# Patient Record
Sex: Female | Born: 1980 | Marital: Married | Smoking: Never smoker
Health system: Northeastern US, Community
[De-identification: ages and names within clinical notes are randomized; demographics above are authoritative.]

## PROBLEM LIST (undated history)

## (undated) DIAGNOSIS — E119 Type 2 diabetes mellitus without complications: Secondary | ICD-10-CM

---
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
## 2002-11-05 DIAGNOSIS — N946 Dysmenorrhea, unspecified: Secondary | ICD-10-CM | POA: Insufficient documentation

## 2004-04-20 ENCOUNTER — Emergency Department (HOSPITAL_BASED_OUTPATIENT_CLINIC_OR_DEPARTMENT_OTHER): Payer: Self-pay | Admitting: Emergency Medicine

## 2004-04-20 LAB — CT CORONAL VIEW-ADDITIONAL VIE

## 2004-04-20 LAB — XR ANKLE LEFT MINIMUM 3 VIEWS

## 2004-04-20 LAB — LUMBAR SPINE, AP, LAT & SPOT

## 2004-04-20 LAB — KNEE, LEFT 4 VIEWS

## 2004-04-20 LAB — XR TIBIA FIBULA LEFT 2 VIEWS

## 2004-04-20 LAB — XR CERVICAL SPINE WITH OBLIQUES 5 VIEWS

## 2004-04-20 LAB — CT CERVICAL SPINE WO CONTRAST

## 2004-04-20 LAB — XR FOOT LEFT MINIMUM 3 VIEWS

## 2004-04-20 LAB — HCG QUALITATIVE SERUM: HCG QUALITATIVE SERUM: NEGATIVE

## 2004-04-20 LAB — XR THORACIC SPINE 2 VIEWS

## 2005-07-17 DIAGNOSIS — Z8742 Personal history of other diseases of the female genital tract: Secondary | ICD-10-CM | POA: Insufficient documentation

## 2005-11-28 DIAGNOSIS — M545 Low back pain, unspecified: Secondary | ICD-10-CM | POA: Insufficient documentation

## 2006-04-03 DIAGNOSIS — R7611 Nonspecific reaction to tuberculin skin test without active tuberculosis: Secondary | ICD-10-CM | POA: Insufficient documentation

## 2006-08-29 ENCOUNTER — Emergency Department: Payer: Self-pay | Admitting: Physician Assistant

## 2006-08-30 LAB — XR LUMBAR SPINE 2 OR 3 VIEWS

## 2006-08-30 LAB — XR FOREARM LEFT 2 VIEWS

## 2006-08-30 LAB — XR RIBS LEFT WITH PA CHEST

## 2006-09-02 LAB — EMERGENCY ROOM NOTE

## 2007-06-30 ENCOUNTER — Emergency Department (HOSPITAL_BASED_OUTPATIENT_CLINIC_OR_DEPARTMENT_OTHER)
Admission: RE | Admit: 2007-06-30 | Disposition: A | Discharge status: Home / Self Care | Payer: Self-pay | Source: Emergency Department | Attending: Physician Assistant | Admitting: Physician Assistant

## 2007-06-30 ENCOUNTER — Encounter (HOSPITAL_BASED_OUTPATIENT_CLINIC_OR_DEPARTMENT_OTHER): Payer: Self-pay

## 2007-06-30 LAB — EMERGENCY ROOM NOTE

## 2007-06-30 MED ORDER — VICODIN 5-500 MG PO TABS
ORAL_TABLET | ORAL | Status: AC
Start: 2007-06-30 — End: 2007-07-30

## 2007-06-30 NOTE — ED Notes (Signed)
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
## 2007-07-01 ENCOUNTER — Ambulatory Visit (HOSPITAL_BASED_OUTPATIENT_CLINIC_OR_DEPARTMENT_OTHER): Payer: Self-pay | Admitting: Occupational Medicine

## 2014-01-21 ENCOUNTER — Ambulatory Visit (HOSPITAL_BASED_OUTPATIENT_CLINIC_OR_DEPARTMENT_OTHER): Payer: Self-pay | Admitting: Internal Medicine

## 2014-01-21 LAB — XR WRIST RIGHT MINIMUM 3 VIEWS

## 2014-01-21 LAB — XR ANKLE LEFT MINIMUM 3 VIEWS

## 2014-01-26 ENCOUNTER — Ambulatory Visit (HOSPITAL_BASED_OUTPATIENT_CLINIC_OR_DEPARTMENT_OTHER): Payer: Self-pay | Admitting: Internal Medicine

## 2014-01-28 ENCOUNTER — Ambulatory Visit (HOSPITAL_BASED_OUTPATIENT_CLINIC_OR_DEPARTMENT_OTHER): Payer: Self-pay | Admitting: Internal Medicine

## 2014-02-04 ENCOUNTER — Ambulatory Visit (HOSPITAL_BASED_OUTPATIENT_CLINIC_OR_DEPARTMENT_OTHER): Payer: Self-pay | Admitting: Internal Medicine

## 2014-09-02 DIAGNOSIS — M79672 Pain in left foot: Secondary | ICD-10-CM | POA: Insufficient documentation

## 2014-09-02 DIAGNOSIS — N644 Mastodynia: Secondary | ICD-10-CM | POA: Insufficient documentation

## 2014-09-02 DIAGNOSIS — M79641 Pain in right hand: Secondary | ICD-10-CM | POA: Insufficient documentation

## 2014-11-26 DIAGNOSIS — G43009 Migraine without aura, not intractable, without status migrainosus: Secondary | ICD-10-CM | POA: Insufficient documentation

## 2015-07-07 DIAGNOSIS — E6609 Other obesity due to excess calories: Secondary | ICD-10-CM | POA: Insufficient documentation

## 2015-07-07 DIAGNOSIS — E559 Vitamin D deficiency, unspecified: Secondary | ICD-10-CM | POA: Insufficient documentation

## 2015-12-22 DIAGNOSIS — R29818 Other symptoms and signs involving the nervous system: Secondary | ICD-10-CM | POA: Insufficient documentation

## 2016-03-23 DIAGNOSIS — B009 Herpesviral infection, unspecified: Secondary | ICD-10-CM | POA: Insufficient documentation

## 2016-06-02 ENCOUNTER — Emergency Department (HOSPITAL_BASED_OUTPATIENT_CLINIC_OR_DEPARTMENT_OTHER)
Admission: RE | Admit: 2016-06-02 | Disposition: A | Discharge status: Home / Self Care | Payer: Self-pay | Source: Emergency Department | Attending: Emergency Medicine | Admitting: Emergency Medicine

## 2016-06-02 ENCOUNTER — Encounter (HOSPITAL_BASED_OUTPATIENT_CLINIC_OR_DEPARTMENT_OTHER): Payer: Self-pay

## 2016-06-02 MED ORDER — IBUPROFEN 800 MG PO TABS: 800 mg | tablet | Freq: Three times a day (TID) | ORAL | 0 refills | 0 days | Status: AC | PRN

## 2016-06-02 MED ORDER — IBUPROFEN 800 MG PO TABS
800.0000 mg | ORAL_TABLET | Freq: Once | ORAL | Status: DC
Start: 2016-06-02 — End: 2016-06-02
  Filled 2016-06-02: qty 1

## 2016-06-02 MED ORDER — IBUPROFEN 800 MG PO TABS
800.0000 mg | ORAL_TABLET | Freq: Three times a day (TID) | ORAL | 0 refills | Status: AC | PRN
Start: 2016-06-02 — End: 2016-06-07
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
## 2016-06-02 NOTE — Narrator Note (Signed)
Patient Disposition    Patient education for diagnosis, medications, activity, diet and follow-up.  Patient left ED 9:54 AM.  Patient rep left before she received written instructions.  Interpreter to provide instructions: No    Patient belongings with patient: YES    Have all existing LDAs been addressed? N/A    Have all IV infusions been stopped? N/A    Discharged to: Discharged to home
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
## 2016-06-02 NOTE — Narrator Note (Signed)
Pt left prior to review of d/c paperwork and medication for pain. Able to ambulate steadily out of ED.

## 2016-06-02 NOTE — Discharge Instructions (Signed)
Contusin  (Contusion)  Una contusin es un hematoma profundo. Las contusiones son el resultado de una lesin que causa sangrado debajo de la piel. La zona de la contusin puede ponerse [place], [place] o [place]. Las lesiones menores causarn contusiones sin Social research officer, government, Armed forces training and education officer las ms graves pueden presentar dolor e inflamacin durante un par de semanas.   CAUSAS   Generalmente, una contusin se debe a un golpe, un traumatismo o una fuerza directa en una zona del cuerpo.  SNTOMAS    Hinchazn y enrojecimiento en la zona de la lesin.   Hematomas en la zona de la lesin.   Dolor con la palpacin y sensibilidad en la zona de la lesin.   Dolor.  DIAGNSTICO   Se puede establecer el diagnstico al hacer una historia clnica y un examen fsico. Letitia Caul vez sea necesario hacer una radiografa, una tomografa computarizada o una resonancia magntica para determinar si hay lesiones asociadas, como fracturas.  TRATAMIENTO   El tratamiento especfico depender de la zona del cuerpo donde se produjo la lesin. En general, el mejor tratamiento para una contusin es el reposo, la aplicacin de hielo, la elevacin de la zona y la aplicacin de compresas fras en la zona de la lesin. Para calmar el dolor tambin podrn recomendarle medicamentos de venta libre. Pregntele al mdico cul es el mejor tratamiento para su contusin.  INSTRUCCIONES PARA EL CUIDADO EN EL HOGAR    Aplique hielo sobre la zona lesionada.    Ponga el hielo en una bolsa plstica.    Colquese una toalla entre la piel y la bolsa de hielo.    Deje el hielo durante 15 a 81mnutos, 3 a 4veces por da, o sGibsonlos medicamentos de venta libre o recetados para cGlass blower/designer el malestar o la fLos Barreras segn se lo indique el mdico. El mdico podr indicarle que evite tomar antiinflamatorios (aspirina, ibuprofeno y naproxeno) durante 47horas ya que estos medicamentos pueden aumentar los hematomas.   Mantenga la zona de la lesin  en reposo.   Si es posible, eleve la zona de la lesin para reducir la hinchazn.  SOLICITE ATENCIN MDICA DE INMEDIATO SI:    El hematoma o la hinchazn aumentan.   Siente dolor que [place]   La hinchazn o el dolor no se [employer]  ASEGRESE DE QUE:    Comprende estas instrucciones.   Controlar su afeccin.   Recibir ayuda de inmediato si no mejora o si empeora.     Esta informacin no tiene cMarine scientistel consejo del mdico. Asegrese de hacerle al mdico cualquier pregunta que tenga.     Document Released: 04/11/2005 Document Revised: 07/07/2013  Elsevier Interactive Patient Education [employer]

## 2016-08-07 DIAGNOSIS — R002 Palpitations: Secondary | ICD-10-CM | POA: Insufficient documentation

## 2016-08-08 DIAGNOSIS — R0789 Other chest pain: Secondary | ICD-10-CM | POA: Insufficient documentation

## 2016-08-29 DIAGNOSIS — D0502 Lobular carcinoma in situ of left breast: Secondary | ICD-10-CM | POA: Insufficient documentation

## 2016-10-17 DIAGNOSIS — M722 Plantar fascial fibromatosis: Secondary | ICD-10-CM | POA: Insufficient documentation

## 2016-11-24 DIAGNOSIS — R1013 Epigastric pain: Secondary | ICD-10-CM | POA: Insufficient documentation

## 2016-12-25 DIAGNOSIS — R300 Dysuria: Secondary | ICD-10-CM | POA: Insufficient documentation

## 2017-04-08 DIAGNOSIS — R102 Pelvic and perineal pain unspecified side: Secondary | ICD-10-CM | POA: Insufficient documentation

## 2017-04-08 DIAGNOSIS — M542 Cervicalgia: Secondary | ICD-10-CM | POA: Insufficient documentation

## 2017-04-08 DIAGNOSIS — R1319 Other dysphagia: Secondary | ICD-10-CM | POA: Insufficient documentation

## 2017-04-09 DIAGNOSIS — Z30431 Encounter for routine checking of intrauterine contraceptive device: Secondary | ICD-10-CM | POA: Insufficient documentation

## 2017-04-09 DIAGNOSIS — R399 Unspecified symptoms and signs involving the genitourinary system: Secondary | ICD-10-CM | POA: Insufficient documentation

## 2017-04-17 DIAGNOSIS — R7989 Other specified abnormal findings of blood chemistry: Secondary | ICD-10-CM | POA: Insufficient documentation

## 2017-05-13 DIAGNOSIS — R0781 Pleurodynia: Secondary | ICD-10-CM | POA: Insufficient documentation

## 2017-05-13 DIAGNOSIS — R0789 Other chest pain: Secondary | ICD-10-CM | POA: Insufficient documentation

## 2017-09-16 DIAGNOSIS — K7581 Nonalcoholic steatohepatitis (NASH): Secondary | ICD-10-CM | POA: Insufficient documentation

## 2017-09-16 LAB — HEMOGLOBIN A1C CARE EVERYWHERE
HEMOGLOBIN A1C CARE EVERYWHERE: 5.7 % — NL (ref 4.3–6.4)
MEAN BLOOD GLUCOSE CARE EVERYWHERE: 117 mg/dL — NL

## 2017-10-15 DIAGNOSIS — R1030 Lower abdominal pain, unspecified: Secondary | ICD-10-CM | POA: Insufficient documentation

## 2017-10-15 DIAGNOSIS — K648 Other hemorrhoids: Secondary | ICD-10-CM | POA: Insufficient documentation

## 2017-10-15 DIAGNOSIS — I8390 Asymptomatic varicose veins of unspecified lower extremity: Secondary | ICD-10-CM | POA: Insufficient documentation

## 2017-10-15 DIAGNOSIS — B349 Viral infection, unspecified: Secondary | ICD-10-CM | POA: Insufficient documentation

## 2017-10-15 DIAGNOSIS — K5900 Constipation, unspecified: Secondary | ICD-10-CM | POA: Insufficient documentation

## 2018-08-25 ENCOUNTER — Emergency Department
Admission: EM | Admit: 2018-08-25 | Discharge: 2018-08-25 | Disposition: A | Discharge status: Home / Self Care | Payer: No Typology Code available for payment source | Source: Intra-hospital | Attending: Emergency Medicine | Admitting: Emergency Medicine

## 2018-08-25 ENCOUNTER — Encounter (HOSPITAL_BASED_OUTPATIENT_CLINIC_OR_DEPARTMENT_OTHER): Payer: Self-pay

## 2018-08-25 ENCOUNTER — Emergency Department (HOSPITAL_BASED_OUTPATIENT_CLINIC_OR_DEPARTMENT_OTHER): Payer: No Typology Code available for payment source

## 2018-08-25 DIAGNOSIS — R0789 Other chest pain: Secondary | ICD-10-CM | POA: Diagnosis present

## 2018-08-25 DIAGNOSIS — R918 Other nonspecific abnormal finding of lung field: Secondary | ICD-10-CM | POA: Diagnosis not present

## 2018-08-25 DIAGNOSIS — R079 Chest pain, unspecified: Secondary | ICD-10-CM

## 2018-08-25 DIAGNOSIS — R1012 Left upper quadrant pain: Secondary | ICD-10-CM | POA: Diagnosis not present

## 2018-08-25 HISTORY — DX: Type 2 diabetes mellitus without complications: E11.9

## 2018-08-25 LAB — CBC, PLATELET & DIFFERENTIAL
ABSOLUTE BASO COUNT: 0.1 10*3/uL (ref 0.0–0.1)
ABSOLUTE EOSINOPHIL COUNT: 0.2 10*3/uL (ref 0.0–0.8)
ABSOLUTE IMM GRAN COUNT: 0.02 10*3/uL (ref 0.00–0.03)
ABSOLUTE LYMPH COUNT: 3.9 10*3/uL (ref 0.6–5.9)
ABSOLUTE MONO COUNT: 0.5 10*3/uL (ref 0.2–1.4)
ABSOLUTE NEUTROPHIL COUNT: 3.2 10*3/uL (ref 1.6–8.3)
ABSOLUTE NRBC COUNT: 0 10*3/uL (ref 0.0–0.0)
BASOPHIL %: 0.6 % (ref 0.0–1.2)
EOSINOPHIL %: 2.6 % (ref 0.0–7.0)
HEMATOCRIT: 37.5 % (ref 34.1–44.9)
HEMOGLOBIN: 12.2 g/dL (ref 11.2–15.7)
IMMATURE GRANULOCYTE %: 0.3 % (ref 0.0–0.4)
LYMPHOCYTE %: 49.4 % (ref 15.0–54.0)
MEAN CORP HGB CONC: 32.5 g/dL (ref 31.0–37.0)
MEAN CORPUSCULAR HGB: 30.7 pg (ref 26.0–34.0)
MEAN CORPUSCULAR VOL: 94.5 fl (ref 80.0–100.0)
MEAN PLATELET VOLUME: 11.5 fL (ref 8.7–12.5)
MONOCYTE %: 6.5 % (ref 4.0–13.0)
NEUTROPHIL %: 40.6 % (ref 40.0–75.0)
NRBC %: 0 % (ref 0.0–0.0)
PLATELET COUNT: 264 10*3/uL (ref 150–400)
RBC DISTRIBUTION WIDTH STD DEV: 43 fL (ref 35.1–46.3)
RED BLOOD CELL COUNT: 3.97 M/uL (ref 3.90–5.20)
WHITE BLOOD CELL COUNT: 7.8 10*3/uL (ref 4.0–11.0)

## 2018-08-25 LAB — HEPATIC FUNCTION PANEL
ALANINE AMINOTRANSFERASE: 114 U/L — ABNORMAL HIGH (ref 12–45)
ALBUMIN: 3.4 g/dL (ref 3.4–5.0)
ALKALINE PHOSPHATASE: 118 U/L — ABNORMAL HIGH (ref 45–117)
ASPARTATE AMINOTRANSFERASE: 77 U/L — ABNORMAL HIGH (ref 8–34)
BILIRUBIN DIRECT: 0.1 mg/dl (ref 0.0–0.2)
BILIRUBIN TOTAL: 0.3 mg/dL (ref 0.2–1.0)
INDIRECT BILIRUBIN: 0.2 mg/dL (ref 0.2–0.9)
TOTAL PROTEIN: 6.8 g/dL (ref 6.4–8.2)

## 2018-08-25 LAB — BASIC METABOLIC PANEL
ANION GAP: 12 mmol/L (ref 5–15)
BUN (UREA NITROGEN): 9 mg/dL (ref 7–18)
CALCIUM: 7.7 mg/dL — ABNORMAL LOW (ref 8.5–10.1)
CARBON DIOXIDE: 24 mmol/L (ref 21–32)
CHLORIDE: 106 mmol/L (ref 98–107)
CREATININE: 0.9 mg/dL (ref 0.4–1.2)
ESTIMATED GLOMERULAR FILT RATE: >60 mL/min (ref >60)
Glucose Random: 249 mg/dL — ABNORMAL HIGH (ref 74–160)
POTASSIUM: 3.7 mmol/L (ref 3.5–5.1)
SODIUM: 142 mmol/L (ref 136–145)

## 2018-08-25 LAB — LIPASE: LIPASE: 208 U/L (ref 73–393)

## 2018-08-25 LAB — ETHANOL: ETHANOL: 158 mg/dL — ABNORMAL HIGH (ref 0–3)

## 2018-08-25 LAB — TROPONIN I
TROPONIN I: <0.02 ng/mL (ref 0.00–0.04)
TROPONIN I: <0.02 ng/mL (ref 0.00–0.04)

## 2018-08-25 MED ORDER — ALUMINUM & MAGNESIUM HYDROXIDE 200-200 MG/5ML PO SUSP
30.0000 mL | Freq: Once | ORAL | Status: AC
Start: 2018-08-25 — End: 2018-08-25
  Administered 2018-08-25: 30 mL via ORAL
  Filled 2018-08-25: qty 30

## 2018-08-25 MED ORDER — FAMOTIDINE 20 MG PO TABS
20.00 mg | ORAL_TABLET | Freq: Two times a day (BID) | ORAL | 0 refills | Status: AC
Start: 2018-08-25 — End: 2018-09-01

## 2018-08-25 MED ORDER — LIDOCAINE VISCOUS HCL 2 % MT SOLN
10.0000 mL | Freq: Once | OROMUCOSAL | Status: AC
Start: 2018-08-25 — End: 2018-08-25
  Administered 2018-08-25: 10 mL via OROMUCOSAL
  Filled 2018-08-25: qty 15

## 2018-08-25 MED ORDER — FAMOTIDINE 20 MG PO TABS: 20 mg | tablet | Freq: Two times a day (BID) | ORAL | 0 refills | 0 days | Status: AC

## 2018-08-25 MED ORDER — ONDANSETRON HCL 4 MG/2ML IJ SOLN
INTRAMUSCULAR | Status: DC
Start: 2018-08-25 — End: 2018-08-25
  Filled 2018-08-25: qty 2

## 2018-08-25 MED ORDER — FAMOTIDINE 20 MG PO TABS
20.0000 mg | ORAL_TABLET | Freq: Once | ORAL | Status: AC
Start: 2018-08-25 — End: 2018-08-25
  Administered 2018-08-25: 20 mg via ORAL
  Filled 2018-08-25: qty 1

## 2018-08-25 NOTE — Narrator Note (Signed)
Pt resting in bed comfortably. Will continue to monitor
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
## 2018-08-25 NOTE — Narrator Note (Signed)
Pt stated she feels better but it still a little nauseated.  Pt asking for something to drink.  Provider aware and stated ok for pt to drink.
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
## 2018-08-25 NOTE — ED Notes (Signed)
Bed: 14-A  Expected date:   Expected time:   Means of arrival:   Comments:  CP

## 2018-08-25 NOTE — Narrator Note (Signed)
Patient Disposition    Patient education for diagnosis, medications, activity, diet and follow-up.  Patient left ED 7:14 AM.  Patient rep received written instructions.  Interpreter to provide instructions: No    Patient belongings with patient: YES    Have all existing LDAs been addressed? N/A    Have all IV infusions been stopped? Yes    Discharged to: Discharged to home

## 2018-08-25 NOTE — Narrator Note (Signed)
Pt states ready to go home. Pt discharge home. Pt denied having any question on discharge instructions.

## 2018-08-25 NOTE — Narrator Note (Signed)
Pt to Xray at this time

## 2018-08-28 LAB — EKG

## 2018-09-22 DIAGNOSIS — R109 Unspecified abdominal pain: Secondary | ICD-10-CM | POA: Insufficient documentation

## 2018-10-10 ENCOUNTER — Encounter (HOSPITAL_BASED_OUTPATIENT_CLINIC_OR_DEPARTMENT_OTHER): Payer: Self-pay | Admitting: Emergency Medicine

## 2018-10-10 ENCOUNTER — Emergency Department
Admission: EM | Admit: 2018-10-10 | Discharge: 2018-10-10 | Disposition: A | Discharge status: Home / Self Care | Payer: No Typology Code available for payment source | Source: Intra-hospital | Attending: Emergency Medicine | Admitting: Emergency Medicine

## 2018-10-10 ENCOUNTER — Emergency Department (HOSPITAL_BASED_OUTPATIENT_CLINIC_OR_DEPARTMENT_OTHER): Payer: No Typology Code available for payment source

## 2018-10-10 ENCOUNTER — Other Ambulatory Visit: Payer: Self-pay

## 2018-10-10 DIAGNOSIS — R079 Chest pain, unspecified: Secondary | ICD-10-CM | POA: Diagnosis not present

## 2018-10-10 DIAGNOSIS — R05 Cough: Secondary | ICD-10-CM

## 2018-10-10 DIAGNOSIS — J069 Acute upper respiratory infection, unspecified: Secondary | ICD-10-CM | POA: Diagnosis not present

## 2018-10-10 DIAGNOSIS — R509 Fever, unspecified: Secondary | ICD-10-CM | POA: Diagnosis present

## 2018-10-10 DIAGNOSIS — M791 Myalgia, unspecified site: Secondary | ICD-10-CM | POA: Diagnosis not present

## 2018-10-10 LAB — COMPREHENSIVE METABOLIC PANEL
ALANINE AMINOTRANSFERASE: 213 U/L — ABNORMAL HIGH (ref 12–45)
ALBUMIN: 3.4 g/dL (ref 3.4–5.0)
ALKALINE PHOSPHATASE: 99 U/L (ref 45–117)
ANION GAP: 9 mmol/L (ref 5–15)
ASPARTATE AMINOTRANSFERASE: 234 U/L — ABNORMAL HIGH (ref 8–34)
BILIRUBIN TOTAL: 0.3 mg/dL (ref 0.2–1.0)
BUN (UREA NITROGEN): 6 mg/dL — ABNORMAL LOW (ref 7–18)
CALCIUM: 8.4 mg/dL — ABNORMAL LOW (ref 8.5–10.1)
CARBON DIOXIDE: 26 mmol/L (ref 21–32)
CHLORIDE: 105 mmol/L (ref 98–107)
CREATININE: 0.8 mg/dL (ref 0.4–1.2)
ESTIMATED GLOMERULAR FILT RATE: >60 mL/min (ref >60)
Glucose Random: 204 mg/dL — ABNORMAL HIGH (ref 74–160)
POTASSIUM: 3.6 mmol/L (ref 3.5–5.1)
SODIUM: 140 mmol/L (ref 136–145)
TOTAL PROTEIN: 7.2 g/dL (ref 6.4–8.2)

## 2018-10-10 LAB — CBC, PLATELET & DIFFERENTIAL
ABSOLUTE BASO COUNT: 0 10*3/uL (ref 0.0–0.1)
ABSOLUTE EOSINOPHIL COUNT: 0 10*3/uL (ref 0.0–0.8)
ABSOLUTE IMM GRAN COUNT: 0.01 10*3/uL (ref 0.00–0.03)
ABSOLUTE LYMPH COUNT: 2 10*3/uL (ref 0.6–5.9)
ABSOLUTE MONO COUNT: 0.4 10*3/uL (ref 0.2–1.4)
ABSOLUTE NEUTROPHIL COUNT: 1.7 10*3/uL (ref 1.6–8.3)
ABSOLUTE NRBC COUNT: 0 10*3/uL (ref 0.0–0.0)
BASOPHIL %: 0.5 % (ref 0.0–1.2)
EOSINOPHIL %: 0.2 % (ref 0.0–7.0)
HEMATOCRIT: 40.2 % (ref 34.1–44.9)
HEMOGLOBIN: 13.3 g/dL (ref 11.2–15.7)
IMMATURE GRANULOCYTE %: 0.2 % (ref 0.0–0.4)
LYMPHOCYTE %: 48.8 % (ref 15.0–54.0)
MEAN CORP HGB CONC: 33.1 g/dL (ref 31.0–37.0)
MEAN CORPUSCULAR HGB: 31.1 pg (ref 26.0–34.0)
MEAN CORPUSCULAR VOL: 94.1 fl (ref 80.0–100.0)
MEAN PLATELET VOLUME: 11.8 fL (ref 8.7–12.5)
MONOCYTE %: 10 % (ref 4.0–13.0)
NEUTROPHIL %: 40.3 % (ref 40.0–75.0)
NRBC %: 0 % (ref 0.0–0.0)
PLATELET COUNT: 146 10*3/uL — ABNORMAL LOW (ref 150–400)
RBC DISTRIBUTION WIDTH STD DEV: 44.1 fL (ref 35.1–46.3)
RED BLOOD CELL COUNT: 4.27 M/uL (ref 3.90–5.20)
WHITE BLOOD CELL COUNT: 4.2 10*3/uL (ref 4.0–11.0)

## 2018-10-10 LAB — MAGNESIUM: MAGNESIUM: 2.1 mg/dL (ref 1.8–2.4)

## 2018-10-10 MED ORDER — BENZONATATE 100 MG PO CAPS
100.00 mg | ORAL_CAPSULE | Freq: Three times a day (TID) | ORAL | 0 refills | Status: AC | PRN
Start: 2018-10-10 — End: 2018-10-15

## 2018-10-10 MED ORDER — BENZONATATE 100 MG PO CAPS: 100 mg | capsule | Freq: Three times a day (TID) | ORAL | 0 refills | 0 days | Status: AC | PRN

## 2018-10-10 NOTE — Narrator Note (Signed)
Patient Disposition  Patient education for diagnosis, medications, activity, diet and follow-up.  Patient left ED 6:18 PM.  Patient rep received written instructions.    Interpreter to provide instructions: Yes; Interpreter ID: [id number]    Patient belongings with patient: YES    Have all existing LDAs been addressed? Yes    Have all IV infusions been stopped? Yes    Destination: Discharged to home per provider with discharge instructions and prescription. Pt awake, alert, speaking full sentences. No distress noted. Pt ambulated independently out of ED

## 2018-10-10 NOTE — ED Triage Note (Signed)
Pt came in complaining of dry cough, SOB, and fever since yesterday. Pt has been taking motrin for fever last dose this morning at 8 am. Pt reports her husband was admitted today for suspected COVID.     Rn notes strong non productive cough form pt. Pt sating 96% on RA. VSS, NKA

## 2018-10-10 NOTE — Discharge Instructions (Addendum)
You were seen in the emergency department for cough, fever and body aches.  We checked your blood work which was mostly within normal limits except for some mild elevation in your liver enzymes.  Review of your records shows that your liver enzymes have been previously elevated.  A chest x-ray did not show any evidence of pneumonia.  You were given a liter of IV fluids.  At this time there is no concern for an acute emergency.  We sent a coronavirus test swab.  This results is not available for a few days.  You should self isolate for 14 days until your lab results come back.  Call your primary care physician in 48 hours to schedule a follow-up appointment.  Return to the emergency department if you develop severe shortness of breath, severe chest pain or high fevers unresolved with medication.

## 2018-10-10 NOTE — Narrator Note (Signed)
Pt receiving chest XRAY

## 2018-10-10 NOTE — Narrator Note (Signed)
Pt ambulated independently form stretcher to bedside commode. Pt continues to have strong non productive cough sating 98% on RA

## 2018-10-10 NOTE — ED Provider Notes (Signed)
eMERGENCY dEPARTMENT Resident NOTE    The ED nursing record was reviewed.   The prior medical records as available electronically through Epic were reviewed.  This patient was seen with Emergency Department attending physician Dr.Lai Donalee Citrin    CHIEF COMPLAINT    Patient presents with:  Cough  Breathing Problem  Fever      HPI    Alexis Guzman is a 38 year old female right breast medical history of left breast cancer presenting with a chief complaint of fever, cough and body aches x6 days.  Patient has a sick contact in her husband who is currently admitted and being ruled out for COVID-19.  Patient states she is gotten a T-max of 100.3 at home.  Endorses a dry cough as well as "lung pain".  Also endorsing abdominal pain and single episode of diarrhea.  No nausea or vomiting.  Endorsing generalized body aches and abdominal pain that is diffuse.  Denies weeping, cigarette smoking or other recreational drugs.      REVIEW OF SYSTEMS    The pertinent positives are reviewed in the HPI above. All other systems were reviewed and are negative.    PAST MEDICAL HISTORY    Past Medical History:  No date: Diabetes mellitus (H[facility])    PROBLEM LIST  There is no problem list on file for this patient.      SURGICAL HISTORY    History reviewed. No pertinent surgical history.    CURRENT MEDICATIONS    No current facility-administered medications for this encounter.   No current outpatient medications on file.    ALLERGIES    Review of Patient's Allergies indicates:  No Known Allergies    FAMILY HISTORY    History reviewed.  No pertinent family history.      SOCIAL HISTORY    Social History     Socioeconomic History    Marital status: Married     Spouse name: Not on file    Number of children: Not on file    Years of education: Not on file    Highest education level: Not on file   Occupational History    Not on file   Social Needs    Financial resource strain: Not on file    Food insecurity:     Worry: Not on file     Inability:  Not on file    Transportation needs:     Medical: Not on file     Non-medical: Not on file   Tobacco Use    Smoking status: Never Smoker    Smokeless tobacco: Never Used   Substance and Sexual Activity    Alcohol use: Yes    Drug use: Never    Sexual activity: Not on file   Lifestyle    Physical activity:     Days per week: Not on file     Minutes per session: Not on file    Stress: Not on file   Relationships    Social connections:     Talks on phone: Not on file     Gets together: Not on file     Attends religious service: Not on file     Active member of club or organization: Not on file     Attends meetings of clubs or organizations: Not on file     Relationship status: Not on file    Intimate partner violence:     Fear of current or ex partner: Not on file  Emotionally abused: Not on file     Physically abused: Not on file     Forced sexual activity: Not on file   Other Topics Concern    Not on file   Social History Narrative    Not on file         PHYSICAL EXAM      Vital Signs: BP 136/95  Pulse 82  Temp 99.1 F  Resp 18  Wt 88 kg (194 lb)  SpO2 96%     Constitutional: Well-developed, Well-nourished, Non-toxic appearance, appears ill and uncomfortable, coughing multiple times, Speaking full sentences.  HEENT: NC, AT, PERRLA, EOMI   Neck: No C-Spine Tenderness; Normal range of motion, non-tender, Supple with no meningismus; no stridor.   Lymphatic: No lymphadenopathy noted.   Cardio.: RRR, No MRG's. Radial pulses 2+ B/L; No LE edema  Pulmonary: Normal BS's bilat., No tachypnea, wheezes, rales or rhonchi;  Non-labored w/o retractions or accessory muscle use, or tripoding  Abd. + BS throughout. Soft, diffuse TTP, No masses, rebound or guarding. No Murphy's, McBurney's, or Rovsing's. No striae, hernia, caput medusa or Turners;  GU:  No CVA tenderness.  Musculoskeletal:  Moving all 4 extremities, No major deformities noted. Ambulatory with steady gait.  Neurological: CNII-XII grossly intact, AOx3, No  focal deficits noted. Sensation is intact, Motor strength is 5/5 U/L extremity  Psychiatric: Appropriate for age and situation.        RESULTS  No results found for this visit on 10/10/18 (from the past 24 hour(s)).     RADIOLOGY  CXR    EKG:  Normal sinus rhythm    MEDICATIONS ADMINISTERED ON THIS VISIT  No orders of the defined types were placed in this encounter.      ED COURSE & MEDICAL DECISION MAKING      I reviewed the patient's past medical history/problem list, past surgical history, medication list, social history and allergies.    ED Decision Making & Course: Pt is a 38 year old female WITH pmh as above for C/O fever, cough and body aches x 6 days with close contact in her husband who is currently admitted and being ruled out for COVID 19. Obtaining CBC, CMP, EKG and COVID 19 swab. CXR for r/o PNA. Giving a litter of Fluid.  Patient's lab work returned nonactionable.  Elevation LFTs however review of patient's care everywhere chart showed that patient has had elevations in LFTs.  Patient's white count within normal limits.  Chest x-ray did not reveal any evidence of pneumonia.  Patient's vital signs remained stable in emergency department.  She was afebrile.  Patient is okay to be discharged at this time.  She has been asked to self isolate for 14 days or until the results of her COVID-19 testing has returned.  She is given an excuse note for work stating that she is okay to return to work in April thyroid or sooner if her symptoms have resolved.  Pt remained hemodynamically stable during their stay in the emergency department.

## 2018-10-14 LAB — EKG

## 2018-10-15 LAB — COVID-19 NASOPHARYN (LABCORP): COVID-19 NASOPHARYN (LABCORP): DETECTED — CR

## 2018-10-16 ENCOUNTER — Other Ambulatory Visit: Payer: Self-pay

## 2018-10-16 ENCOUNTER — Ambulatory Visit: Payer: No Typology Code available for payment source | Attending: Internal Medicine | Admitting: Internal Medicine

## 2018-10-16 NOTE — Progress Notes (Signed)
COVID RESULT CALL AND ASSESSMENT:    This patient was identified as meeting criteria for a televisit rather than an in-person visit in the setting of the [place] pandemic and known COVID infection. A complete assessment and plan is detailed in the note, all of which were conducted remotely using telephone technology.  Patient identity was verbally confirmed by the patient/guardian with 2 identifiers (name, date of birth, and/or address) at the beginning of the visit  Patient/guardian verbally consented to care by televisit as appropriate.   Patient/guardian was located at home during the visit and confirmed that they understood they were encouraged to be in private location due to personal health information being discussed.  Patient/guardian was informed how to access face-to-face care in the event of an emergency.  Provider was located outside the office at a secure location during the visit.  If this is a new patient visit, all available records and medical history were reviewed by the provider.    Called patient to discuss COVID-19 result.    Result: POSITIVE.    Call number: 1    Patient answered phone? yes    If no answer, left message? N/A    First day of symptoms: 3/27  Current day of symptoms: 6    Has the patient been self-isolating? yes    List all household members, whether they are self-quarantining, and whether they are symptomatic:  40yo, 18 yo, 10 yo sons, and 48 yo girl  All quarantine  Oldest son with new onset of cough  Husb tested positive (he has been admitted to [facility], diabetic)    Patient description of symptoms including description of whether they are improving:   Cough, andShe's feeling better  Initially had a fever, but it has resolved x 5 days  Cough has resolved x 2 days      Worsening dyspnea?  No    If dyspnea, calculate Roth score: Ask the patient to take a deep breath and count from 1 to 30. Time the patient.   Counting number (number reached): n/a   Number of seconds  counting: n/a    Assessment and Plan:    COVID-19 infection    Assessment:    1. Patient is Low risk for complications and hospitalization based on co-morbidities  2. Respiratory symptoms are: Improving  3. If any respiratory symptoms, Jabier Mutton score is:  Counting number < 10? n/a  Number of seconds counted < 7? n/a  4. Other symptoms are: Improving  5. Day of symptoms: 6    If worsening respiratory symptoms or YES to either Roth score question, consider in-person evaluation. If > 10 day and low risk, follow up PRN only. In other cases, all patients must have Day 7 and 10 follow ups. Use complete clinical picture to determine additional follow up beyond Day 10 if necessary.    Plan:    If concern for secondary diagnosis (e.g. secondary bacterial PNA) or Rx needed, document here:  n/a    Review precautions:    1. Reviewed self-isolation for patients:  a. Review self-isolation  b. First day of d/c self-isolation (minimum 7 days and afebrile and symptomatically improved for 24 hours):   2. Reviewed advice for household members:  a. All asymptomatic household members should self-quarantine for 14 days  b. Symptomatic household members have presumed COVID and should contact their PCPs   Notes: Pt PCP off [place], pt instructed to call PCP with results.   -- Provided [place] #  [phone] for further instructions  3.   If patient has not already done so, advise to inform all other close contacts    Anticipatory guidance:    If she develops new symptoms or worsening dyspnea, she should call [facility] and press "0".    Disposition:   Stable for scheduled follow-up televisit, documented in spreadsheet: Day # 10, date: 10/20/18    I spent 20 minutes on the phone with this patient/proxy of which > 50% was in counseling and coordination of care.

## 2018-10-20 ENCOUNTER — Ambulatory Visit: Payer: No Typology Code available for payment source | Attending: Internal Medicine | Admitting: Internal Medicine

## 2018-10-20 ENCOUNTER — Other Ambulatory Visit: Payer: Self-pay

## 2018-10-20 DIAGNOSIS — R059 Cough, unspecified: Secondary | ICD-10-CM | POA: Insufficient documentation

## 2018-10-20 DIAGNOSIS — R05 Cough: Secondary | ICD-10-CM | POA: Insufficient documentation

## 2018-10-20 NOTE — Progress Notes (Signed)
COVID-19 POSITIVE FOLLOW-UP ASSESSMENT:    This patient was identified as meeting criteria for a televisit rather than an in-person visit in the setting of the [place] pandemic and known COVID infection. A complete assessment and plan is detailed in the note, all of which were conducted remotely using telephone technology.  Patient identity was verbally confirmed by the patient/guardian with 2 identifiers (name, date of birth, and/or address) at the beginning of the visit  Patient/guardian verbally consented to care by televisit as appropriate.   Patient/guardian was located home during the visit and confirmed that they understood they were encouraged to be in private location due to personal health information being discussed.  Patient/guardian was informed how to access face-to-face care in the event of an emergency.  Provider was located outside the office at a secure location during the visit.  If this is a new patient visit, all available records and medical history were reviewed by the provider.    Result: POSITIVE.    Call number: 2    Patient answered phone? yes    If no answer, left message? N/A    First day of symptoms: 3/27    Current day of symptoms:10    Patient description of symptoms (better/worse): better    Worsening dyspnea? no    Calculate Roth score: Ask the patient to take a deep breath and count from 1 to 30. Time the patient.   Counting number (number reached): na   Number of seconds counting: na    Has the patient been self-isolating? yes  Have the patient's close contacts been self-quarantining? Yes. Husn=band now out of hospital and everyone recovering    Assessment and Plan:    COVID-19 infection    Respiratory symptoms are: are improving    Other concerning symptoms? No    Roth score:   Counting number < 10? na   Number of seconds counted < 7? na    If worsening symptoms or Roth score counting number < 10 or number of seconds counted < 7, consider in-person  reevaluation.    Review precautions:     Self-isolation for 7 days from onset of symptoms AND until afebrile and symptoms improving x 72 hours and self-quarantine for 14 days from patient's first day of symptoms   Advised patient that if she develops new symptoms or worsening dyspnea, she should call clinic at None: yes   If patient no longer under self-isolation (note: must be seven days from first day of symptoms and symptomatically improved x 72 hours), counseled patient about social distancing: yes    Disposition: No further f/u needed    I spent 15 minutes on the phone with this patient/proxy of which > 50% was in counseling and coordination of care.

## 2018-10-24 DIAGNOSIS — R1013 Epigastric pain: Secondary | ICD-10-CM | POA: Insufficient documentation

## 2018-10-24 DIAGNOSIS — N83201 Unspecified ovarian cyst, right side: Secondary | ICD-10-CM | POA: Insufficient documentation

## 2018-11-14 LAB — COVID-19 CARE EVERYWHERE

## 2018-11-15 LAB — COVID-19 CARE EVERYWHERE: COVID-19 CARE EVERYWHERE: NEGATIVE — NL

## 2019-12-22 LAB — HEMOGLOBIN A1C CARE EVERYWHERE
HEMOGLOBIN A1C CARE EVERYWHERE: 8.8 % — ABNORMAL HIGH (ref 4.3–5.6)
MEAN BLOOD GLUCOSE CARE EVERYWHERE: 206 mg/dL — NL

## 2020-01-04 ENCOUNTER — Emergency Department
Admission: EM | Admit: 2020-01-04 | Discharge: 2020-01-04 | Disposition: A | Discharge status: Home / Self Care | Payer: No Typology Code available for payment source | Source: Intra-hospital | Attending: Student in an Organized Health Care Education/Training Program | Admitting: Student in an Organized Health Care Education/Training Program

## 2020-01-04 ENCOUNTER — Emergency Department (HOSPITAL_BASED_OUTPATIENT_CLINIC_OR_DEPARTMENT_OTHER): Payer: No Typology Code available for payment source

## 2020-01-04 ENCOUNTER — Other Ambulatory Visit: Payer: Self-pay

## 2020-01-04 DIAGNOSIS — R1012 Left upper quadrant pain: Secondary | ICD-10-CM | POA: Diagnosis not present

## 2020-01-04 DIAGNOSIS — K5900 Constipation, unspecified: Secondary | ICD-10-CM

## 2020-01-04 DIAGNOSIS — F50814 Binge eating disorder, in remission: Secondary | ICD-10-CM | POA: Insufficient documentation

## 2020-01-04 DIAGNOSIS — K760 Fatty (change of) liver, not elsewhere classified: Secondary | ICD-10-CM

## 2020-01-04 DIAGNOSIS — K5730 Diverticulosis of large intestine without perforation or abscess without bleeding: Secondary | ICD-10-CM | POA: Diagnosis not present

## 2020-01-04 DIAGNOSIS — R195 Other fecal abnormalities: Secondary | ICD-10-CM | POA: Insufficient documentation

## 2020-01-04 DIAGNOSIS — K625 Hemorrhage of anus and rectum: Secondary | ICD-10-CM

## 2020-01-04 DIAGNOSIS — R7303 Prediabetes: Secondary | ICD-10-CM | POA: Insufficient documentation

## 2020-01-04 DIAGNOSIS — R079 Chest pain, unspecified: Secondary | ICD-10-CM

## 2020-01-04 DIAGNOSIS — F410 Panic disorder [episodic paroxysmal anxiety] without agoraphobia: Secondary | ICD-10-CM | POA: Insufficient documentation

## 2020-01-04 DIAGNOSIS — F331 Major depressive disorder, recurrent, moderate: Secondary | ICD-10-CM | POA: Insufficient documentation

## 2020-01-04 DIAGNOSIS — F5081 Binge eating disorder: Secondary | ICD-10-CM | POA: Insufficient documentation

## 2020-01-04 DIAGNOSIS — Z6841 Body Mass Index (BMI) 40.0 and over, adult: Secondary | ICD-10-CM | POA: Insufficient documentation

## 2020-01-04 LAB — CBC, PLATELET & DIFFERENTIAL
ABSOLUTE BASO COUNT: 0.1 10*3/uL (ref 0.0–0.1)
ABSOLUTE EOSINOPHIL COUNT: 0.2 10*3/uL (ref 0.0–0.8)
ABSOLUTE IMM GRAN COUNT: 0.03 10*3/uL (ref 0.00–0.03)
ABSOLUTE LYMPH COUNT: 2.6 10*3/uL (ref 0.6–5.9)
ABSOLUTE MONO COUNT: 0.6 10*3/uL (ref 0.2–1.4)
ABSOLUTE NEUTROPHIL COUNT: 3.5 10*3/uL (ref 1.6–8.3)
ABSOLUTE NRBC COUNT: 0 10*3/uL (ref 0.0–0.0)
BASOPHIL %: 0.9 % (ref 0.0–1.2)
EOSINOPHIL %: 2.2 % (ref 0.0–7.0)
HEMATOCRIT: 40.6 % (ref 34.1–44.9)
HEMOGLOBIN: 13.6 g/dL (ref 11.2–15.7)
IMMATURE GRANULOCYTE %: 0.4 % (ref 0.0–0.4)
LYMPHOCYTE %: 37.4 % (ref 15.0–54.0)
MEAN CORP HGB CONC: 33.5 g/dL (ref 31.0–37.0)
MEAN CORPUSCULAR HGB: 31.3 pg (ref 26.0–34.0)
MEAN CORPUSCULAR VOL: 93.5 fl (ref 80.0–100.0)
MEAN PLATELET VOLUME: 11 fL (ref 8.7–12.5)
MONOCYTE %: 8.2 % (ref 4.0–13.0)
NEUTROPHIL %: 50.9 % (ref 40.0–75.0)
NRBC %: 0 % (ref 0.0–0.0)
PLATELET COUNT: 275 10*3/uL (ref 150–400)
RBC DISTRIBUTION WIDTH STD DEV: 43.2 fL (ref 35.1–46.3)
RED BLOOD CELL COUNT: 4.34 M/uL (ref 3.90–5.20)
WHITE BLOOD CELL COUNT: 6.9 10*3/uL (ref 4.0–11.0)

## 2020-01-04 LAB — COMPREHENSIVE METABOLIC PANEL
ALANINE AMINOTRANSFERASE: 71 U/L — ABNORMAL HIGH (ref 12–45)
ALBUMIN: 3.5 g/dL (ref 3.4–5.0)
ALKALINE PHOSPHATASE: 112 U/L (ref 45–117)
ANION GAP: 10 mmol/L (ref 5–15)
ASPARTATE AMINOTRANSFERASE: 50 U/L — ABNORMAL HIGH (ref 8–34)
BILIRUBIN TOTAL: 0.6 mg/dL (ref 0.2–1.0)
BUN (UREA NITROGEN): 10 mg/dL (ref 7–18)
CALCIUM: 8.5 mg/dL (ref 8.5–10.1)
CARBON DIOXIDE: 25 mmol/L (ref 21–32)
CHLORIDE: 105 mmol/L (ref 98–107)
CREATININE: 0.7 mg/dL (ref 0.4–1.2)
ESTIMATED GLOMERULAR FILT RATE: >60 mL/min (ref >60)
Glucose Random: 200 mg/dL — ABNORMAL HIGH (ref 74–160)
POTASSIUM: 3.9 mmol/L (ref 3.5–5.1)
SODIUM: 139 mmol/L (ref 136–145)
TOTAL PROTEIN: 7.3 g/dL (ref 6.4–8.2)

## 2020-01-04 LAB — HCG QUALITATIVE SERUM: HCG QUALITATIVE SERUM: NEGATIVE

## 2020-01-04 LAB — TYPE AND SCREEN
ABO/RH INTERPRETATION: O POS
ANTIBODY SCREEN SOLID PHASE: NEGATIVE

## 2020-01-04 LAB — LIPASE: LIPASE: 129 U/L (ref 73–393)

## 2020-01-04 LAB — TROPONIN I: TROPONIN I: <0.02 ng/mL (ref 0.00–0.04)

## 2020-01-04 LAB — MAGNESIUM: MAGNESIUM: 2.1 mg/dL (ref 1.8–2.4)

## 2020-01-04 MED ORDER — SODIUM CHLORIDE 0.9 % IV BOLUS
1000.0000 mL | Freq: Once | INTRAVENOUS | Status: AC
Start: 2020-01-04 — End: 2020-01-04
  Administered 2020-01-04: 1000 mL via INTRAVENOUS

## 2020-01-04 MED ORDER — ALUMINUM & MAGNESIUM HYDROXIDE 200-200 MG/5ML PO SUSP
30.0000 mL | Freq: Once | ORAL | Status: AC
Start: 2020-01-04 — End: 2020-01-04
  Administered 2020-01-04: 30 mL via ORAL
  Filled 2020-01-04: qty 30

## 2020-01-04 MED ORDER — POLYETHYLENE GLYCOL 3350 17 G PO PACK
17.00 g | PACK | Freq: Two times a day (BID) | ORAL | 0 refills | Status: AC
Start: 2020-01-04 — End: 2020-01-09

## 2020-01-04 MED ORDER — LIDOCAINE VISCOUS HCL 2 % MT SOLN
10.0000 mL | Freq: Once | OROMUCOSAL | Status: AC
Start: 2020-01-04 — End: 2020-01-04
  Administered 2020-01-04: 10 mL via OROMUCOSAL
  Filled 2020-01-04: qty 15

## 2020-01-04 MED ORDER — BISACODYL 10 MG/30ML PR ENEM
1.00 | ENEMA | Freq: Every day | RECTAL | 0 refills | Status: AC | PRN
Start: 2020-01-04 — End: 2020-01-07

## 2020-01-04 MED ORDER — IOHEXOL 350 MG/ML IV SOLN
85.00 mL | Freq: Once | INTRAVENOUS | Status: AC
Start: 2020-01-04 — End: 2020-01-04
  Administered 2020-01-04: 85 mL via INTRAVENOUS

## 2020-01-04 MED ORDER — NORMAL SALINE FLUSH 0.9 % IV SOLN
55.00 mL | Freq: Once | INTRAVENOUS | Status: AC
Start: 2020-01-04 — End: 2020-01-04
  Administered 2020-01-04: 55 mL via INTRAVENOUS

## 2020-01-04 MED ORDER — MAGNESIUM CITRATE PO SOLN
296.00 mL | Freq: Once | ORAL | 0 refills | Status: AC
Start: 2020-01-04 — End: 2020-01-04

## 2020-01-04 NOTE — Discharge Instructions (Signed)
Your lab work was unremarkable, your CT scan demonstrated large amounts of stool in your intestines consistent with constipation.    Drink magnesium citrate to stimulate a bowel movement.  Try MiraLAX twice daily to facilitate bowel movements.  Take Zofran as needed for any nausea.    Follow with your primary care doctor regarding ongoing symptoms.  Return to the emergency department if you develop any severe/worsening symptoms.

## 2020-01-04 NOTE — Narrator Note (Signed)
Patient Disposition  Patient education for diagnosis, medications, activity, diet and follow-up.  Patient left ED 7:14 PM.  Patient rep received written instructions.    Interpreter to provide instructions: Yes; Interpreter ID:      Patient belongings with patient: YES    Have all existing LDAs been addressed? Yes    Have all IV infusions been stopped? Yes    Destination: Discharged to home    Verbal and written instructions given. Patient understands plan of care and reasons for returning to the ED. Encouraged patient to follow up with PCP.

## 2020-01-04 NOTE — ED Triage Note (Signed)
Pt self presents to the ED c/o L side rib area pain. Pain started 6-7 weeks ago at her back and lasted two weeks before radiating to L rib area. + nausea w/o vomiting. States BM w BRB yesterday. Denies hx hemorrhoids. Denies urinary sxs.

## 2020-01-04 NOTE — ED Provider Notes (Signed)
[facility] PA Note      History of Present Illness:    Alexis Guzman is a 39 year old female with obesity, NASH, chronic abdominal pain, elevated LFTs presents with 6 to 7 weeks of left upper quadrant pain.  Initially started for 2 weeks in her back but then radiated and migrated into her left upper quadrant.  Associate with some nausea.  Stabbing.  Now some bloody stool this morning.  Nothing specific makes the pain worse or better.  Denies any history of hemorrhoids.  Denies any history of abdominal surgeries.  No medications taken. Alexis Guzman   MRN: [id number]  PCP: Binnie Rail, MD    Arrived to the ED by: Self    History provided by: the patient   Other: Reviewed nursing notes   Abd Surgeries: denied  Denies recent abx, travel, sick contacts.  Patient denies any trauma, fever, chills, headache, lightheadedness, dizziness, syncope, cough, hemoptysis, SOB, CP, vomiting , diarrhea, hematemesis, dysuria, hematuria, increased urinary frequency of small amounts, vaginal bleeding, vaginal d/c, dyspareunia, black stool, diaphoresis, numbness, tingling, weakness, arthralgias, rash. Review of Systems:  All other systems are reviewed and are negative except as noted in HPI.        Past Medical Hx:  Past Medical History:  No date: Diabetes mellitus ([place]) Meds:  Current Facility-Administered Medications   Medication    lidocaine (XYLOCAINE) 2 % viscous solution 10 mL    aluminum-magnesium hydroxide (MAALOX) 200-200 mg/5 mL suspension 30 mL    sodium chloride 0.9 % IV bolus 1,000 mL     No current outpatient medications on file.         Past Surgical Hx:  No past surgical history on file. 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    Head: Normocephalic and atraumatic.     ENT: clear, mucous membranes moist, normal phonation    Neck: FAROM w/o discomfort, supple w/o signs of meningismus.    Cardiovascular: Heart rate is regular in rate and rhythm. Pulses are 2+ and symmetric.     Respiratory/chest: CTAB. No wheezing, rales, rhonchi. No respiratory distress, speaks in full sentences.  No stridor, accessory muscle use or tripoding.     Gastrointestinal: non-tender, soft, non-distended. No rebound, guarding, or masses. No murphy's, mcburney's, or rovsing's.   Rectal: Performed with chaperone  Anus w/o lesions, hemorrhoids, condyloma, or gross blood. Sphincter tone normal.  Vault: w/o hemorrhoids or masses, scant stool Guiaic postive w/ positive control    Gu:  No CVAT b/l    Back: No  tenderness. Spine is nontender.    Musculoskeletal: Normal muscle tone, moving all extremities, normal gait.    Skin: Warm and well perfused, no rashes or erythema/ecchymosis.    Neurologic: Alert and oriented x 3, no focal deficits.    Psych: Normal affect, appropriate for situation    Medications Given in the ED:      Medications   lidocaine (XYLOCAINE) 2 % viscous solution 10 mL (10 mLs Mouth/Throat Given 01/04/20 1638)   aluminum-magnesium hydroxide (MAALOX) 200-200 mg/5 mL suspension 30 mL (30 mLs Oral Given 01/04/20 1638)   sodium chloride 0.9 % IV bolus 1,000 mL (1,000 mLs Intravenous New Bag 01/04/20 1639)    Radiology and ECG:    CT Abdomen & Pelvis W IV Contrast    (Results Pending)          Lab Results:      Labs Reviewed   COMPREHENSIVE METABOLIC PANEL - Abnormal; Notable for the following  components:       Result Value    Glucose Random 200 (*)     ASPARTATE AMINOTRANSFERASE 50 (*)     ALANINE AMINOTRANSFERASE 71 (*)     All other components within normal limits   CBC, PLATELET & DIFFERENTIAL   MAGNESIUM   LIPASE   TROPONIN I   HCG QUALITATIVE SERUM   TYPE AND SCREEN    Narrative:     Pt.been pregnant/transfused in the previous 3 months?->No    Vital Signs:       01/04/20  1546   BP: 126/76   Pulse: 81   Resp: 18   Temp: 97.6 F   SpO2: 99%            EKG:  Normal sinus rhythm at 77 bpm.  Normal axis.  PR interval 134 ms.  QRS of 90 ms.  QTC of 4 and 39 ms.  No evidence of acute ST or T wave changes.    ED Course and Medical Decision Making:    Pt is a 39 year old female with a history of obesity, NASH, chronic abdominal pain, elevated LFTs presents with left upper quadrant pain and fecal occult blood.  Abdomen is nontender.    Blood work is not concerning.  CT pending.    Signed out to Dr. Verner Chol        DIAGNOSIS  LUQ abdominal pain  Fecal occult blood test positive            Electronically signed by:   Tamsen Meek, PA-C, 01/04/2020 5:25 PM  Dept[facility] [place]

## 2020-01-04 NOTE — Narrator Note (Signed)
Pt to CT

## 2020-01-05 LAB — EKG

## 2020-03-07 LAB — POC HEMOGLOBIN A1C CARE EVERYWHERE: POC HGB A1C CARE EVERYWHERE: 8.4 % — ABNORMAL HIGH (ref 4.3–5.6)

## 2020-04-30 ENCOUNTER — Emergency Department
Admission: EM | Admit: 2020-04-30 | Discharge: 2020-04-30 | Disposition: A | Discharge status: Home / Self Care | Payer: No Typology Code available for payment source | Attending: Emergency Medicine | Admitting: Emergency Medicine

## 2020-04-30 ENCOUNTER — Emergency Department (HOSPITAL_BASED_OUTPATIENT_CLINIC_OR_DEPARTMENT_OTHER): Payer: No Typology Code available for payment source

## 2020-04-30 DIAGNOSIS — R1012 Left upper quadrant pain: Secondary | ICD-10-CM | POA: Insufficient documentation

## 2020-04-30 DIAGNOSIS — K5900 Constipation, unspecified: Secondary | ICD-10-CM

## 2020-04-30 DIAGNOSIS — R109 Unspecified abdominal pain: Secondary | ICD-10-CM

## 2020-04-30 DIAGNOSIS — R079 Chest pain, unspecified: Secondary | ICD-10-CM | POA: Diagnosis not present

## 2020-04-30 DIAGNOSIS — R10A2 Flank pain, left side: Secondary | ICD-10-CM

## 2020-04-30 LAB — CBC, PLATELET & DIFFERENTIAL
ABSOLUTE BASO COUNT: 0.1 10*3/uL (ref 0.0–0.1)
ABSOLUTE EOSINOPHIL COUNT: 0.2 10*3/uL (ref 0.0–0.8)
ABSOLUTE IMM GRAN COUNT: 0.04 10*3/uL — ABNORMAL HIGH (ref 0.00–0.03)
ABSOLUTE LYMPH COUNT: 2.1 10*3/uL (ref 0.6–5.9)
ABSOLUTE MONO COUNT: 0.4 10*3/uL (ref 0.2–1.4)
ABSOLUTE NEUTROPHIL COUNT: 4.7 10*3/uL (ref 1.6–8.3)
ABSOLUTE NRBC COUNT: 0 10*3/uL (ref 0.0–0.0)
BASOPHIL %: 0.7 % (ref 0.0–1.2)
EOSINOPHIL %: 2.1 % (ref 0.0–7.0)
HEMATOCRIT: 41.4 % (ref 34.1–44.9)
HEMOGLOBIN: 13.5 g/dL (ref 11.2–15.7)
IMMATURE GRANULOCYTE %: 0.5 % — ABNORMAL HIGH (ref 0.0–0.4)
LYMPHOCYTE %: 28.2 % (ref 15.0–54.0)
MEAN CORP HGB CONC: 32.6 g/dL (ref 31.0–37.0)
MEAN CORPUSCULAR HGB: 30.9 pg (ref 26.0–34.0)
MEAN CORPUSCULAR VOL: 94.7 fl (ref 80.0–100.0)
MEAN PLATELET VOLUME: 10.8 fL (ref 8.7–12.5)
MONOCYTE %: 5.5 % (ref 4.0–13.0)
NEUTROPHIL %: 63 % (ref 40.0–75.0)
NRBC %: 0 % (ref 0.0–0.0)
PLATELET COUNT: 269 10*3/uL (ref 150–400)
RBC DISTRIBUTION WIDTH STD DEV: 44.3 fL (ref 35.1–46.3)
RED BLOOD CELL COUNT: 4.37 M/uL (ref 3.90–5.20)
WHITE BLOOD CELL COUNT: 7.5 10*3/uL (ref 4.0–11.0)

## 2020-04-30 LAB — URINALYSIS
BILIRUBIN, URINE: NEGATIVE
GLUCOSE, URINE: NEGATIVE MG/DL
KETONE, URINE: NEGATIVE MG/DL
LEUKOCYTE ESTERASE: NEGATIVE
NITRITE, URINE: NEGATIVE
OCCULT BLOOD, URINE: NEGATIVE
PH URINE: 6 (ref 5.0–8.0)
PROTEIN, URINE: NEGATIVE MG/DL
SPECIFIC GRAVITY URINE: 1.025 (ref 1.003–1.035)

## 2020-04-30 LAB — POC URINALYSIS
BILIRUBIN, URINE: NEGATIVE
GLUCOSE,URINE: NEGATIVE
LEUKOCYTE ESTERASE: NEGATIVE
NITRITE, URINE: NEGATIVE
OCCULT BLOOD, URINE: NEGATIVE
PH URINE: 6 (ref 5.0–8.0)
PROTEIN, URINE: NEGATIVE
SPECIFIC GRAVITY, URINE: 1.025 (ref 1.003–1.030)
UROBILINOGEN URINE: 1 — AB (ref 0.2–1.0)

## 2020-04-30 LAB — URINE PREGNANCY TEST (POINT OF CARE): HCG QUALITATIVE URINE: NEGATIVE

## 2020-04-30 LAB — COMPREHENSIVE METABOLIC PANEL
ALANINE AMINOTRANSFERASE: 49 U/L — ABNORMAL HIGH (ref 12–45)
ALBUMIN: 3.6 g/dL (ref 3.4–5.0)
ALKALINE PHOSPHATASE: 105 U/L (ref 45–117)
ANION GAP: 8 mmol/L (ref 5–15)
ASPARTATE AMINOTRANSFERASE: 34 U/L (ref 8–34)
BILIRUBIN TOTAL: 0.5 mg/dL (ref 0.2–1.0)
BUN (UREA NITROGEN): 10 mg/dL (ref 7–18)
CALCIUM: 8.4 mg/dL — ABNORMAL LOW (ref 8.5–10.1)
CARBON DIOXIDE: 27 mmol/L (ref 21–32)
CHLORIDE: 106 mmol/L (ref 98–107)
CREATININE: 0.7 mg/dL (ref 0.4–1.2)
ESTIMATED GLOMERULAR FILT RATE: >60 mL/min (ref >60)
Glucose Random: 185 mg/dL — ABNORMAL HIGH (ref 74–160)
POTASSIUM: 4 mmol/L (ref 3.5–5.1)
SODIUM: 141 mmol/L (ref 136–145)
TOTAL PROTEIN: 7.4 g/dL (ref 6.4–8.2)

## 2020-04-30 LAB — LIPASE: LIPASE: 131 U/L (ref 73–393)

## 2020-04-30 LAB — TROPONIN I
TROPONIN I: <0.02 ng/mL (ref 0.00–0.04)
TROPONIN I: <0.02 ng/mL (ref 0.00–0.04)

## 2020-04-30 LAB — MAGNESIUM: MAGNESIUM: 2.1 mg/dL (ref 1.8–2.4)

## 2020-04-30 MED ORDER — ACETAMINOPHEN 325 MG PO TABS
975.00 mg | ORAL_TABLET | Freq: Once | ORAL | Status: AC
Start: 2020-04-30 — End: 2020-04-30
  Administered 2020-04-30: 975 mg via ORAL
  Filled 2020-04-30: qty 3

## 2020-04-30 MED ORDER — KETOROLAC TROMETHAMINE 30 MG/ML INJ
30.00 mg | Freq: Once | Status: AC
Start: 2020-04-30 — End: 2020-04-30
  Administered 2020-04-30: 30 mg via INTRAMUSCULAR
  Filled 2020-04-30: qty 1

## 2020-04-30 MED ORDER — FAMOTIDINE 20 MG PO TABS
20.00 mg | ORAL_TABLET | Freq: Once | ORAL | Status: AC
Start: 2020-04-30 — End: 2020-04-30
  Administered 2020-04-30: 20 mg via ORAL
  Filled 2020-04-30: qty 1

## 2020-04-30 MED ORDER — CITRATE OF MAGNESIA PO SOLN
148.00 mL | Freq: Once | ORAL | Status: AC
Start: 2020-04-30 — End: 2020-04-30
  Administered 2020-04-30: 148 mL via ORAL
  Filled 2020-04-30: qty 296

## 2020-04-30 MED ORDER — ALUMINUM & MAGNESIUM HYDROXIDE 200-200 MG/5ML PO SUSP
30.0000 mL | Freq: Once | ORAL | Status: AC
Start: 2020-04-30 — End: 2020-04-30
  Administered 2020-04-30: 30 mL via ORAL
  Filled 2020-04-30: qty 30

## 2020-04-30 MED ORDER — SODIUM CHLORIDE 0.9 % IV BOLUS
1000.0000 mL | Freq: Once | INTRAVENOUS | Status: AC
Start: 2020-04-30 — End: 2020-04-30
  Administered 2020-04-30: 1000 mL via INTRAVENOUS

## 2020-04-30 MED ORDER — FAMOTIDINE 20 MG PO TABS
20.00 mg | ORAL_TABLET | Freq: Two times a day (BID) | ORAL | 0 refills | Status: AC
Start: 2020-04-30 — End: 2020-05-30

## 2020-04-30 MED ORDER — FAMOTIDINE 20 MG/2ML IV SOLN
20.00 mg | Freq: Once | INTRAVENOUS | Status: AC
Start: 2020-04-30 — End: 2020-04-30
  Administered 2020-04-30: 20 mg via INTRAVENOUS
  Filled 2020-04-30: qty 2

## 2020-04-30 MED ORDER — LIDOCAINE VISCOUS HCL 2 % MT SOLN
10.0000 mL | Freq: Once | OROMUCOSAL | Status: AC
Start: 2020-04-30 — End: 2020-04-30
  Administered 2020-04-30: 10 mL via OROMUCOSAL
  Filled 2020-04-30: qty 15

## 2020-04-30 MED ORDER — CALCIUM CARBONATE ANTACID 500 MG PO CHEW
500.00 mg | CHEWABLE_TABLET | Freq: Once | ORAL | Status: AC
Start: 2020-04-30 — End: 2020-04-30
  Administered 2020-04-30: 500 mg via ORAL
  Filled 2020-04-30: qty 1

## 2020-04-30 MED ORDER — ONDANSETRON HCL 4 MG/2ML IJ SOLN
4.00 mg | Freq: Once | INTRAMUSCULAR | Status: AC
Start: 2020-04-30 — End: 2020-04-30
  Administered 2020-04-30: 4 mg via INTRAVENOUS
  Filled 2020-04-30: qty 2

## 2020-04-30 MED ORDER — FAMOTIDINE 20 MG PO TABS
20.0000 mg | ORAL_TABLET | Freq: Two times a day (BID) | ORAL | 0 refills | Status: DC
Start: 2020-04-30 — End: 2020-04-30

## 2020-04-30 NOTE — Procedures (Deleted)
Sys, Conversion Provider Not In - 09/09/2013  Formatting of this note might be different from the original.  Exam Number:  [id number]                        Report Status:  Final  Type:  OBUS Limited OB Ultrasound  Date/Time:  11/26/2012 14:26  Exam Code:  [id number]  Ordering Provider:  Norval Morton  Associated Reports:     [id number]:  OBUS-BioPhysical Prof Single  -------------------------------------------------------------------- ----------    HISTORY:         BPP ONLY HX FETAL DEMIST CHECK BB -       REPORT     Requester:  Norval Morton         INDICATION:  Maternal risk factor: History of IUFD increased risk for        stillbirth.  BPP without measurements         Examination of the uterus reveals a single active fetus in vertex        presentation with a posterior placenta.  The fetal heart rate and        amniotic fluid volume appear normal.           The biophysical profile was 8/8.         [place]      PROVIDERS:                           SIGNATURES:       Loura Halt MD                    Loura Halt MD

## 2020-04-30 NOTE — Procedures (Deleted)
Kelli Hope, MD - 11/25/2014  Formatting of this note might be different from the original.  TECHNIQUE:   CTA of the head, including non-contrast and post-contrast images, and image  post-processing.  CTA neck with contrast.    COMPARISON: Head MRI dated 05/26/2012.    FINDINGS:     HEAD CT:    The brain parenchyma demonstrates no significant abnormality.    There is no acute intracranial hemorrhage, infarction or mass lesion. There  is  no abnormal intracranial enhancement.    The ventricles, sulci and cisterns are within normal limits in size and  configuration. No evidence of hydrocephalus.     The visualized paranasal sinuses and mastoid air-cells are well aerated and  clear. The bones, orbits and extracranial soft tissues are unremarkable.    HEAD CTA:    No intracranial aneurysm is identified.    The intracranial internal carotid arteries demonstrate no signif icant stenosis.    The anterior, middle and posterior cerebral arteries demonstrate no significant  stenosis.    The intracranial vertebral arteries and the basilar artery demonstrate no  significant stenosis.    The left posterior communicating artery and an anterior communicating artery are  present. The right posterior communicating artery is not visualized. There is  infundibular origin of the left posterior communicating artery.     The major intracranial venous structures appear normal, without evidence of  thrombosis.    NECK CTA:    Image quality is degraded by artifact related to patient motion.    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 There is no appreciable  stenosis involving the brachiocephalic artery or proximal subclavian arteries.    The left common and internal carotid arteries demonstrate no significant  stenosis.    The right common and in ternal carotid arteries demonstrate no significant  stenosis.    The cervical segments of the vertebral arteries demonstrate no significant  stenosis.    There are scattered bilateral  prominent cervical lymph nodes without suspicious  features, possibly reactive in etiology. The soft tissues of the neck are  otherwise symmetric and within normal limits.    The cervical spine is unremarkable.    No evidence of pneumonia or pulmonary edem a. Incidental note is made of an  indeterminant 4 mm pulmonary nodule at the left lung apex. There is a calcified  granuloma at the right lung apex.    IMPRESSION:   IMPRESSION:    1.  No acute intracranial hemorrhage, large infarct or large intracranial mass.    2.  No significant stenosis in the major cervical or intracranial arteries.    3.  Indeterminate 4 mm nodule at the left lung apex.   Chest CT follow up in 1  year can be consi dered depending on the patient's risk factors (e.g. prior  smoking).      These findings were discussed with Dr. Annice Needy at the time of image  acquisition, 11/25/2014 2:45 PM, who responded indicating the communication was  understood.    ==================================  CAROTID STENOSIS REFERENCE - distal internal carotid artery diameter as the  denominator for stenosis measurement:  MILD = <50% stenosis.  MODERATE =  50-69%  stenosis.  SEVERE =  70-89% stenosis.  HAIRLINE/CRITICAL = 90-99% stenosis.  OCCLUDED = 100% stenosis.  ==================================

## 2020-04-30 NOTE — Procedures (Deleted)
Sys, Conversion Provider Not In - 09/09/2013  Formatting of this note might be different from the original.  Exam Number:  [id number]                        Report Status:  Final  Type:  OBUS-BioPhysical Prof Single  Date/Time:  11/19/2012 09:15  Exam Code:  [id number]  Ordering Provider:  Norval Morton  Associated Reports:       [id number]:  OBUS Limited OB Ultrasound  ------------------------------------------------------------------ ------------    HISTORY:         BPP ONLY HX IUFD @35 .6 -       REPORT     Requester:  Norval Morton         INDICATION:  Maternal risk factor: History of IUFD, increased risk        for stillbirth.  BPP without measurements         Examination of the uterus reveals a single active fetus in vertex        presentation with a posterior placenta.  The fetal heart rate and        amniotic fluid volume appear normal.          The bio physical profile was 8/8.         sd      PROVIDERS:                           SIGNATURES:       Loura Halt MD                    Loura Halt MD

## 2020-04-30 NOTE — Procedures (Deleted)
Smith Robert, MD, MBA - 10/21/2015  Formatting of this note might be different from the original.  TECHNIQUE:  Ultrasound evaluation of the [place]. Volumetric sweeps were  obtained and reviewed.    COMPARISON: CT ABDOMEN/PELVIS (KIDNEY STONE) WITHOUT CONTRAST 06/16/2015.    FINDINGS:    RIGHT KIDNEY:  The right kidney measures 11.9 cm.  Parenchyma is within normal  limits.  No hydronephrosis.    LEFT KIDNEY:  The left ki dney measures 11.8 cm.  Parenchyma is within normal  limits.  No hydronephrosis.    BLADDER:  Unremarkable.    Incidental note is made of fatty liver.    IMPRESSION:     No hydronephrosis. No sonographically evident renal stones.    Incidental note is made of fatty liver.

## 2020-04-30 NOTE — Procedures (Deleted)
Sys, Conversion Provider Not In - 09/12/2013  Formatting of this note might be different from the original.  Exam Number:  [id number]                        Report Status:  Final  Type:  OBUS Detailed Single Fetus  Date/Time:  07/23/2012 15:03  Exam Code:  [id number]  Ordering Provider:  Carmell Austria    HISTORY:         FETAL SURVEY -       REPORT     Requester:  Carmell Austria         INDICATION:  Fetal Anatomic Survey.  Mater nal Risk Factor:  History        of IUFD         Examination of the uterus reveals a single active fetus in variable        lie.  The placenta is posterior and does not reach the cervix.  The        fetal heart rate and the amniotic fluid volume appear normal. No        adnexal masses were seen.         The cervix measures > 3 cm in length without funneling.          A detailed evaluation of the fetal anatomy appears normal.  This         includes lateral ventricles, cavum septum pellucidum, cerebellum,        cisterna magna, face, thorax, heart (four chamber view and outflow        tracts), stomach, bladder, kidneys, umbilical cord insertion site,        spine, and all four extremities (length and architecture) .  There is        a three vessel umbilical cord and no abnormalities of the placenta        were noted.            Biometry is consistent with 17 weeks and 5  days, based on a prior        scan, with an [place] of June13, 2014.         sd/th      PROVIDERS:                           SIGNATURES:       Loura Halt MD                    Loura Halt MD

## 2020-04-30 NOTE — Procedures (Deleted)
Sys, Conversion Provider Not In - 11/23/2013  Formatting of this note might be different from the original.  Exam Number:  [id number]                        Report Status:  Final  Type:  US Pelvic Denny Levy  Date/Time:  05/09/2012 12:15  Exam Code:  [id number]  Ordering Provider:  Leighton Roach MD    HISTORY:         Absence of menstruation - Pain- Pelvic, unspecified site - lmp 03/18/12 bhcg 04/14/12 was 33--looking for iup       REPORT     TECHNIQUE:       Transabdominal and transvaginal ultrasound imaging of the pelvis was        performed.            COMPARISON: None available.         FINDINGS:         KIDNEYS: Unremarkable.         UTERUS:  The uterus measures 10.8 x 6.3cm.  The endometrial        echocomplex is thickened as there is a single intrauterine pregnancy.        There is a fetal pole visualized, with a fetal heart rate of 176        beats per  minute beats per minute on M mode.  The crown rump length        measures 12.4 mm, corresponding to an estimated gestational age of [redacted]        weeks and 3 days.  There is a yolk sac visualized.           OVARIES/ADNEXA: Unremarkable         PELVIS:  No free fluid.           Incidental note of fatty liver.         IMPRESSION:          SINGLE INTRAUTERINE PREGNANCY, WITH A FETAL HEART RATE OF 176 BEATS        PER MINUTE; ESTIMATED GESTATIO NAL AGE OF 7 WEEKS AND 3 DAYS.      PROVIDERS:                           SIGNATURES:       Regan Lemming MD                Regan Lemming MD

## 2020-04-30 NOTE — Procedures (Deleted)
Sys, Conversion Provider Not In - 11/26/2013  Formatting of this note might be different from the original.  Exam Number:  [id number]                        Report Status:  Final  Type:  OBUS Detailed Single Fetus  Date/Time:  06/14/2011 10:58  Exam Code:  [id number]  Ordering Provider:  Alwyn Ren R.    HISTORY:         fetal survey -       REPORT     Requester:  Angelica Pou         INDICATION:  Fetal Anatomic  Survey.         Examination of the uterus reveals a single active fetus in variable        lie.  The placenta is anterior and does not reach the cervix.  The        fetal heart rate and the amniotic fluid volume appear normal.  The        left ovary appears normal.  The right ovary was not visualized.  No        adnexal masses were seen.         The cervix measures > 3 cm in length without funneling.          A detailed evaluation of t he fetal anatomy appears normal.  This        includes lateral ventricles, cavum septum pellucidum, cerebellum,        cisterna magna, face, thorax, heart (four chamber view and outflow        tracts), stomach, bladder, kidneys, umbilical cord insertion site,        spine, and all four extremities (length and architecture).  There is        a three vessel umbilical cord and no abnormalities of the placenta        were noted.             Biometry is consistent with 19 weeks and 2 days, based on prior scan,        with an EDC of 11/06/2011.         A normal survey is thought to decrease the probability of Down        syndrome by approximately 50%.         mk      PROVIDERS:                           SIGNATURES:       Luster Landsberg MD                     Luster Landsberg MD

## 2020-04-30 NOTE — Procedures (Deleted)
Lorinda Creed, MD - 11/24/2018  Formatting of this note might be different from the original.  PROCEDURE: CT ABDOMEN/PELVIS (KIDNEY STONE) WITHOUT CONTRAST    TECHNIQUE:   Diagnostic CT of the abdomen and pelvis WITHOUT intravenous contrast.     COMPARISON: CT abdomen/pelvis 03/27/2018    FINDINGS:    ABSENCE OF INTRAVENOUS CONTRAST DECREASES SENSITIVITY FOR DETECTION OF FOCAL LESIONS AND VASCULAR PATHOLOGY.    LOWER THORAX: Nor mal.    HEPATOBILIARY: No focal hepatic lesions.  No biliary ductal dilatation. Prior cholecystectomy.  SPLEEN: No splenomegaly.  PANCREAS: No focal masses or ductal dilatation.    ADRENALS: No adrenal nodules.  KIDNEYS/URETERS: No hydronephrosis, stones, or solid mass lesions.  PELVIC ORGANS/BLADDER: IUD in place.    PERITONEUM / RETROPERITONEUM: No free air or fluid.  LYMPH NODES: No lymphadenopathy.  VESSELS: Unremarkable.    GI TRAC T: No distention or wall thickening. Normal appendix. Colonic diverticulosis    BONES AND SOFT TISSUES: Unremarkable.    IMPRESSION:   No urolithiasis or hydronephrosis.    ATTESTATION: I, Dr. Lorinda Creed as teaching physician, have reviewed the images for this case and if necessary edited the report originally created by Dr. Ovid Curd.

## 2020-04-30 NOTE — Procedures (Deleted)
Lance Muss, MD - 01/28/2018  Formatting of this note might be different from the original.  HISTORY:  39 year old female seen for diagnostic evaluation. Patient reports left breast lower outer quadrant pain for the past 4 months which has been worsening over the past month. She denies skin changes or nipple discharge. Pain does not fluctuate with the menstrual cycle.    PROCEDURE:  Diagnostic mammographic and tomosynthesis vie ws were obtained. Computer-aided detection was utilized by the radiologist in the interpretation of this examination.    BILATERAL MAMMOGRAM:  The present study is compared to previous imaging.  The breast tissue is extremely dense. This may lower the sensitivity of mammography.    There is no mammographic abnormality in  the area of clinical concern as indicated by the patient in the left breast at 4 o'clock located 3 centimeters from  the nipple.  No suspicious masses, calcifications or other abnormalities are seen in the left  breast.    No suspicious masses, calcifications or other abnormalities are seen in the right breast.    BREAST ULTRASOUND  A focused ultrasound was performed of the left breast at 4 o'clock located 3 centimeters from the nipple.  No suspicious sonographic abnormality is present in the area of clinical concern as indicated by the patient in the  left breast at 4 o'clock located 3 centimeters from the nipple.    IMPRESSION:   There is no specific mammographic or sonographic evidence of malignancy. No mammographic or sonographic findings to explain the patient's left breast pain.    In the absence of imaging findings any decision for further intervention, at this time, should be based on the clinical assessment. These findings were explained to the patient. She will follow-up wi th Dr. Ysidro Evert.    BI-RADS Category 1: Negative

## 2020-04-30 NOTE — Procedures (Deleted)
Clayborne Artist, MD, PhD - 02/23/2020  Formatting of this note might be different from the original.  HISTORY:  39 year old female seen for diagnostic evaluation of left breast pain.    PROCEDURE:  Diagnostic mammographic and tomosynthesis views were obtained. Computer-aided detection was utilized by the radiologist in the interpretation of this examination.    BILATERAL MAMMOGRAM:  The present study is compared to previous imaging.  The b reast tissue is heterogeneously dense, which could obscure detection of small masses.    No suspicious masses, calcifications or other abnormalities are seen in either breast.    No mammographic abnormality to correspond to left breast upper outer quadrant pain.    BREAST ULTRASOUND  At time of ultrasound the patient indicated area of pain to span the left breast upper outer and lower outer quadrants. This entire area was scanned and no  sonographic abnormality identified that would account for the breast pain.    Incidentally seen at 12:00 3 cm from nipple is an oval hypoechoic parallel mass measuring 10 x 5 x 7 mm.    Additional images acquired at 4:00 9 cm from nipple in an area of more prominent pain, without sonographic abnormality seen.    IMPRESSION:     1. No mammographic or sonographic abnormality to account for left breast pain involving the upper and lower o uter quadrants as indicated by patient. In the absence of imaging findings, any decision for further intervention, at this time, should be based on the clinical assessment. These findings were explained to the patient. She was encouraged to follow-up with her referring provider.    2. Incidental 10 mm oval mass in the left breast is probably benign, likely fibroadenoma. Follow up ultrasound in 6 months recommended.    3. No mammographic  evidence of malignancy in the right breast.    BI-RADS Category 3: Probably Benign Finding    This report has been forwarded to an automated communication system which will  electronically notify appropriate providers of potentially important findings. The Breast Imaging Division will contact the patient directly to schedule the diagnostic examination.

## 2020-04-30 NOTE — Procedures (Deleted)
Sys, Conversion Provider Not In - 09/09/2013  Formatting of this note might be different from the original.  Exam Number:  [id number]                        Report Status:  Final  Type:  Chest Single View  Date/Time:  12/04/2012 17:53  Exam Code:  HENI7  Ordering Provider:  Arie Sabina    HISTORY:         h/o positive PPD - history of positive PPD - Assess for evidence of TB      REPORT     A single AP view of the chest.         CO MPARISON: Single view chest 08/17/2011.         FINDINGS:       Lines/tubes:  None.         Lungs:  The lung volumes are low with bibasilar atelectasis. The        lungs are otherwise clear. There is no evidence of pneumonia or        pulmonary edema.         Pleura:  There is no pleural effusion or pneumothorax.         Heart and mediastinum:  The heart and the mediastinum are stable.         Bones:  The thoracic skeleton is unremarkabl e.         IMPRESSION:        No radiographic evidence of tuberculosis.        PROVIDERS:                           SIGNATURES:       Abbott, Arletta Bale MD               Abbott, Arletta Bale MD

## 2020-04-30 NOTE — Procedures (Deleted)
[name], MD - 03/28/2018  Formatting of this note might be different from the original.  CT ABDOMEN/PELVIS WITH CONTRAST    TECHNIQUE:   CT of the abdomen and pelvis WITH intravenous contrast.     COMPARISON: CT ABDOMEN/PELVIS WITH CONTRAST 2019-Mar-08    FINDINGS:  LOWER THORAX: Normal.    HEPATOBILIARY: Hepatic steatosis. No focal hepatic lesions.  No biliary ductal  dilatation. Prominence of the common bile duct up to 1.2 cm  status post  cholecystectomy. No intrahepatic biliary ductal dilation.  SPLEEN: No splenomegaly.  PANCREAS: No focal masses or ductal dilatation.    ADRENALS: No adrenal nodules.  KIDNEYS/URETERS: No hydronephrosis, stones, or solid mass lesions.  PELVIC ORGANS/BLADDER: IUD in the uterus. 3.8 cm right adnexal cyst, likely  physiologic. Bladder is unremarkable.    PERITONEUM / RETROPERITONEUM: No free air or fluid.  LYMPH NODES: No lymp hadenopathy.  VESSELS: Unremarkable.    GI TRACT: No distention or wall thickening. Normal appendix.    BONES AND SOFT TISSUES: Unremarkable.    IMPRESSION:   No findings to explain left lower quadrant pain. Specifically, no evidence of  diverticulitis.    Hepatic steatosis.                  ATTESTATION: I, Dr. [name] as teaching physician, have reviewed the  images for this case and if necessary edited the report originally cr eated by  Dr. Alinda Money.

## 2020-04-30 NOTE — Procedures (Deleted)
System, Provider Not In, PhD - 07/01/2014  Formatting of this note might be different from the original.  Exam Number:  [id number]                        Report Status:  Final  Type:  Breast UltraUni IncAxillaLimit  Date/Time:  07/01/2014 14:42  Exam Code:  BIUSL/LEFT  Ordering Provider:  Ron Agee MD    HISTORY:         Focal Pain - Focal Pain - Side: Left - Lump 1: - Lump one CM from Nipple: 0.5cm - Lump one Size in MM: 37m  - Lump two CM from Nipple: 2 cm - Lump two Size in MM: 533m- Position Lump one:: 5 o'clock - Position Lump two:: 3 o'clock - Side Lump one::   Left - Side Lump two:: Left; [age]ear old female with glandular tender foci on L breast as described above      REPORT     HISTORY         Area of clinical concern in the left breast         FILMS COMPARED         The present study is compared to previous studies.         TARGETED BREAST ULTRASOU ND       A focused ultrasound was performed of the left breast at 3 o'clock        located 4 centimeters from the nipple.  No macrocyst or solid mass is        identified in the area of clinical concern as indicated by the        patient in the left breast at 3 o'clock.         IMPRESSION:       No sonographic abnormality is identified in the area of clinical        concern.         These findings were explained to the patient. She was  encouraged to        follow-up with her physician. In the absence of imaging findings any        decision for further intervention, at this time, should be based on        the clinical assessment.         BI-RADS Category 2: Benign Finding        PROVIDERS:                           SIGNATURES:       RoKarrie MeresD                             TeGala Romney.D.               Termeulen, DeKelli Churn.

## 2020-04-30 NOTE — Procedures (Deleted)
Sys, Conversion Provider Not In - 11/24/2013  Formatting of this note might be different from the original.  Exam Number:  [id number]                        Report Status:  Final  Type:  OB US <14W TA+TV 1 GESTATION  Date/Time:  01/04/2012 15:58  Exam Code:  [id number]  Ordering Provider:  Theador Hawthorne PAC, SABRINA E  REPORT:       INDICATION: Abdominal/pelvic pain         I. REPORT         Gestational Age (based on clinical data):            5.0  weeks [5 weeks and 0 days]  (EDC 2/21)         NUMBER: [place]       AMNIOTIC FLUID: Normal Volume       PLACENTA: Too early to assess due to gestational age       UTERUS: Normal       OVARIES: Both normal              Comments/Summary         Normally shaped intrauterine gestational sac with no yolk sac,       embryo or heartbeat yet seen.         The scan was performed both transvaginally and transabdominally.       Transvaginal wa s done in order to better assess gestational sac.         I, the teaching physician, have reviewed the images and agree with       the report as written.           II. MEASUREMENTS (mm) AND CALCULATIONS              Basis for GA     GA Estimate (wks)            LMP 4/28                  7.7            CLIN/OTHER                5.0             This report was electronically signed by M.A. [facility] MENDICUTI MD(T)      RADIOLOGISTS:                            SIGNATURESMarcell Anger, MD(R), AYA A                         Lennice Sites MENDICUTI, MD(T), M.A.            Lennice Sites MENDICUTI, MD(T), M.A.                                            Finalized on:  01/04/2012 18:44

## 2020-04-30 NOTE — Procedures (Deleted)
Sys, Conversion Provider Not In - 11/24/2013  Formatting of this note might be different from the original.  Exam Number:  [id number]                        Report Status:  Final  Type:  OBUS Transvaginal Ultrasound  Date/Time:  01/28/2012 16:31  Exam Code:  5726OM  Ordering Provider:  Brand Males MD  Associated Reports:       [id number]:  OBUS Limited OB Ultrasound  ---------------------------------------------------------------- --------------    HISTORY:         viability -       REPORT     Requester:  Brand Males, MD         INDICATION:  Bleeding         Examination of the pelvis reveals a normal anteverted uterus. The        endometrial stripe is thin. There is no evidence of an intrauterine        sac.  The previously seen gestational sac is no longer present (of        note, no yolk sac was ever visible).  The left ovary appears normal.         Th e right ovary has a 1.3 cm sonolucent cyst, smaller than on prior        scan. No adnexal masses were seen.  There is no free fluid in the cul        de sac.         The transvaginal approach was used in the evaluation of the uterus        because adequate transabdominal images could not be obtained. The        sonographer was chaperoned during this portion of the exam.          The patient was to see her provider immediately after the  scan for        further management.          js      PROVIDERS:                           SIGNATURES:       Clayborne Artist MD MD              Clayborne Artist MD MD

## 2020-04-30 NOTE — Procedures (Deleted)
Sys, Conversion Provider Not In - 09/09/2013  Formatting of this note might be different from the original.  Exam Number:  [id number]                        Report Status:  Final  Type:  OBUS-BioPhysical Prof Single  Date/Time:  11/26/2012 14:26  Exam Code:  4436IX  Ordering Provider:  Norval Morton  Associated Reports:       [id number]:  OBUS Limited OB Ultrasound  ------------------------------------------------------------------ ------------    HISTORY:         BPP ONLY HX FETAL DEMIST CHECK BB -       REPORT     Requester:  Norval Morton         INDICATION:  Maternal risk factor: History of IUFD increased risk for        stillbirth.  BPP without measurements         Examination of the uterus reveals a single active fetus in vertex        presentation with a posterior placenta.  The fetal heart rate and        amniotic fluid volume appear normal.           The biophysical profile was 8/8.         [place]cox      PROVIDERS:                           SIGNATURES:       Loura Halt MD                    Loura Halt MD

## 2020-04-30 NOTE — Procedures (Deleted)
Sys, Conversion Provider Not In - 11/24/2013  Formatting of this note might be different from the original.  Exam Number:  [id number]                        Report Status:  Final  Type:  OBUS Transvaginal Ultrasound  Date/Time:  01/11/2012 16:14  Exam Code:  3810FB  Ordering Provider:  Carmell Austria  Associated Reports:       [id number]:  OBUS Limited OB Ultrasound  ------------------------------------------------------------------- -----------    HISTORY:         viability check -       REPORT     Requester:  Carmell Austria         INDICATION:  Bleeding         Examination of the pelvis reveals a normal uterus with a probable        intrauterine gestational sac and possible yolk sac.  The ovaries        appear normal bilaterally.         The transvaginal approach was used in the evaluation of the pelvic        anatomy because adequate transabdominal images c ould not be obtained.        The sonographer was chaperoned during this portion of the exam.         Because we cannot clearly see the yolk sac, the differential        diagnosis still includes early viable intrauterine pregnancy, early        pregnancy failure, and ectopic pregnancy.           A follow up ultrasound in 10 to 14 days or quantitative HCGs should        be considered as clinically indicated.         sm      PROVIDERS:                            SIGNATURES:       Darlin Drop MD                 Darlin Drop MD

## 2020-04-30 NOTE — Procedures (Deleted)
Sys, Conversion Provider Not In - 11/25/2013  Formatting of this note might be different from the original.  Exam Number:  [id number]                        Report Status:  Final  Type:  OBUS-BioPhysical Prof Single  Date/Time:  10/03/2011 09:58  Exam Code:  1456YI  Ordering Provider:  Norval Morton  Associated Reports:     [id number]:  OBUS Limited OB Ultrasound  -------------------------------------------------------------------- ----------    HISTORY:         BPP ONLY, D.F.M, NRNST -       REPORT     Requester:  Norval Morton         INDICATION:  Non reactive NST, decreased fetal movement.  BPP without        measurements         Examination of the uterus reveals a single active fetus in vertex        presentation with a anterior placenta.  The fetal heart rate and        amniotic fluid volume appear normal.         The biophysical profile was 8/8.          The patient was to see Triage immediately after the scan and this        report will be made available.         sm/th      PROVIDERS:                           SIGNATURES:       Loura Halt MD                    Loura Halt MD

## 2020-04-30 NOTE — Procedures (Deleted)
Sys, Conversion Provider Not In - 09/09/2013  Formatting of this note might be different from the original.  Exam Number:  [id number]                        Report Status:  Final  Type:  OBUS Limited OB Ultrasound  Date/Time:  11/12/2012 13:33  Exam Code:  5733WK  Ordering Provider:  Norval Morton  Associated Reports:     [id number]:  OBUS-BioPhysical Prof Single  -------------------------------------------------------------------- ----------    HISTORY:         BPP ONLY HX IUFD @35 .6 -       REPORT     Requester:  Norval Morton         INDICATION:  Maternal risk factor: Hx of IUFD .  BPP without        measurements         Examination of the uterus reveals a single active fetus in vertex        presentation with a posterior placenta.  The fetal heart rate and        amniotic fluid volume appear normal.          The biophysical profile was 8/8.         pb       PROVIDERS:                           SIGNATURES:       Loura Halt MD                    Loura Halt MD

## 2020-04-30 NOTE — Procedures (Deleted)
Sys, Conversion Provider Not In - 11/23/2013  Formatting of this note might be different from the original.  Exam Number:  [id number]                        Report Status:  Final  Type:  OBUS<14WksSingFetW-ERA  Date/Time:  06/18/2012 11:57  Exam Code:  5697XY  Ordering Provider:  Rubin Payor H    HISTORY:         LOW RISK ERA- hx of iufd @ 36wks last preg -       REPORT     Requester:  Lorayne Bender         INDICATION:  Nuchal tran slucency testing.           Examination of the pelvis reveals a normally-shaped single        gestational sac in a normal-appearing uterus containing a        sonographically normal fetus with a normal heart rate.  The placenta        is forming posteriorly and the amniotic fluid volume is normal.  It        is too early to evaluate the cervix.  The ovaries appear normal        bilaterally.  No adnexal masses are seen.         CRL 70 mm ;  NTL 1.7 mm;  nasal bone appears normal.         Biometry is consistent with 12 weeks and 5 days, based on prior scan,        with an EDC of 12/26/2012.         Blood was sent for free beta HCG and PAPP-A determination.  A revised        risk of aneuploidy will be calculated.         tc      PROVIDERS:                           SIGNATURES:       Loura Halt MD                    Loura Halt MD

## 2020-04-30 NOTE — Procedures (Deleted)
Sys, Conversion Provider Not In - 09/09/2013  Formatting of this note might be different from the original.  Exam Number:  [id number]                        Report Status:  Final  Type:  OBUS Limited OB Ultrasound  Date/Time:  11/19/2012 09:15  Exam Code:  5461KE  Ordering Provider:  Norval Morton  Associated Reports:     [id number]:  OBUS-BioPhysical Prof Single  -------------------------------------------------------------------- ----------    HISTORY:         BPP ONLY HX IUFD @35 .6 -       REPORT     Requester:  Norval Morton         INDICATION:  Maternal risk factor: History of IUFD, increased risk        for stillbirth.  BPP without measurements         Examination of the uterus reveals a single active fetus in vertex        presentation with a posterior placenta.  The fetal heart rate and        amniotic fluid volume appear normal.          The bioph ysical profile was 8/8.         sd      PROVIDERS:                           SIGNATURES:       Loura Halt MD                    Loura Halt MD

## 2020-04-30 NOTE — Procedures (Deleted)
Lanier Ensign, MD, PhD - 08/06/2016  Formatting of this note might be different from the original.  [address]  The present study is compared to previous imaging.    BREAST ULTRASOUND  No suspicious findings correlating to the area of clinical concern as indicated by the patient in the lower outer quadrant of the left breast  No suspicious findings corresponding to area of patient's concern in the left lower outer quadrant.    Ov al circumscribed mass measuring 1.8 cm at the 6:00 axis, 3 cm from the nipple, left breast, stable since 2016 MRI.    IMPRESSION:   Area in the left breast is benign.    BI-RADS Category 2: Benign Finding

## 2020-04-30 NOTE — Procedures (Deleted)
Genia Hotter, MD - 04/29/2017  Formatting of this note might be different from the original.  CT ABDOMEN/PELVIS WITH CONTRAST    TECHNIQUE:   Diagnostic CT of the abdomen and pelvis WITH intravenous contrast.     COMPARISON: [facility] 12/20/2013.    FINDINGS:  LOWER THORAX: Normal.    HEPATOBILIARY: Diffuse hepatic steatosis. No focal hepatic lesions.  No biliary  ductal dilatation. Post cholecystectomy.  SPLEEN: No splenomegaly.  PANCREAS:  No focal masses or ductal dilatation.    ADRENALS: No adrenal nodules.  KIDNEYS/URETERS: No hydronephrosis, stones, or solid mass lesions.  PELVIC ORGANS/BLADDER: IUD within the uterus. There is a 2.8 cm right ovarian  cyst that is likely physiologic in a patient of this age.     PERITONEUM / RETROPERITONEUM: No free air or fluid.  LYMPH NODES: No lymphadenopathy.  VESSELS: Unremarkable.    GI TRACT: No distention or wall thickening. N ormal appendix.    BONES AND SOFT TISSUES: No evidence for acute displaced fracture.     IMPRESSION:   No evidence for bowel obstruction or hydronephrosis.      ATTESTATION: I, Dr. Roel Cluck as teaching physician, have reviewed the images  for this case and if necessary edited the report originally created by Dr.  Moshe Salisbury.

## 2020-04-30 NOTE — Procedures (Deleted)
Pandharipande, Grier Rocher, MD, MPH - 03/28/2020  Formatting of this note might be different from the original.  PROCEDURE: CT ABDOMEN/PELVIS (KIDNEY STONE) WITHOUT CONTRAST    TECHNIQUE:   Diagnostic CT of the abdomen and pelvis WITHOUT intravenous contrast.     COMPARISON: 11/24/2018.    FINDINGS:    ABSENCE OF INTRAVENOUS CONTRAST DECREASES SENSITIVITY FOR DETECTION OF FOCAL LESIONS AND VASCULAR PATHOLOGY.    LOWER THORAX: Normal.    HEPAT OBILIARY: Fatty infiltration of the liver. No focal hepatic lesions. No biliary ductal dilatation. Post-cholecystectomy.  SPLEEN: No splenomegaly.  PANCREAS: No focal masses or ductal dilatation.    ADRENALS: No adrenal nodules.  KIDNEYS/URETERS: No hydronephrosis or solid mass lesions. No stones.  PELVIC ORGANS/BLADDER: IUD is again seen within the uterus. No bladder stones.    PERITONEUM / RETROPERITONEUM: No free air or fluid.  LYMPH  NODES: No lymphadenopathy.  VESSELS: Unremarkable.    GI TRACT: No distention or wall thickening. Normal appendix.    BONES AND SOFT TISSUES: No suspicious lytic or blastic lesions. Degenerative changes of the spine.    IMPRESSION:   No stones identified in the urinary tract. No hydronephrosis.    Fatty infiltration of the liver.

## 2020-04-30 NOTE — Procedures (Deleted)
System, Provider Not In, PhD - 02/17/2014  Formatting of this note might be different from the original.  Exam Number:  [id number]                        Report Status:  Final  Type:  MRIArm WWO  Date/Time:  02/17/2014 14:30  Exam Code:  MRIARMWWO/RIGHT  Ordering Provider:  Grant Ruts MD    HISTORY:         Limited movement - Mass or lump - Fall @ work 01/20/2014, struck dorsum of forearm against countertop.  Pain, swelling, indurat ed lump distal extensor side of forearm and extensor/radial side of hand.  No fx.  (+) parethesias.  Check for mass,   hematoma, compartment syndr, infection      REPORT     TECHNIQUE:       Magnetic resonance imaging of the Right forearm was performed WITH        and WITHOUT intravenous contrast.         COMPARISON: Right arm radiographs 06/27/2011.         FINDINGS:        The technologist placed a skin marker over the dorsal lateral  aspect        of the distal forearm, approximately 5.5 cm proximal to the wrist        joint line. Just distal to the skin marker, there is a faint wispy        area of increased T2 signal intensity and enhancement in the        subcutaneous fat compatible with the provided history of recent        trauma. No focal or organized or peripherally enhancing fluid        collection or hematoma. No deep fascial swelling or soft tissue gas         to suggest compartment syndrome. The underlying muscles and tendons        are normal in morphology and signal intensity. Bone marrow is normal,        without fracture or malignant neoplasm.             IMPRESSION:        Soft tissue contusion to the subcutaneous cutaneous fat of the dorsal        lateral aspect of the right distal forearm. No MRI findings of        hematoma or other focal collection or compartment syndrome.           PROVIDERS:                           SIGNATURES:       Felix Ahmadi M.D.                   Richrd Humbles.D.

## 2020-04-30 NOTE — Procedures (Deleted)
Sys, Conversion Provider Not In - 09/09/2013  Formatting of this note might be different from the original.  Exam Number:  [id number]                        Report Status:  Final  Type:  OBUS-BioPhysical Prof Single  Date/Time:  11/12/2012 13:33  Exam Code:  2524ZD  Ordering Provider:  Norval Morton  Associated Reports:       [id number]:  OBUS Limited OB Ultrasound  ------------------------------------------------------------------ ------------    HISTORY:         BPP ONLY HX IUFD @35 .6 -       REPORT     Requester:  Norval Morton         INDICATION:  Maternal risk factor: Hx of IUFD .  BPP without        measurements         Examination of the uterus reveals a single active fetus in vertex        presentation with a posterior placenta.  The fetal heart rate and        amniotic fluid volume appear normal.          The biophysical profile was 8/8.         p b      PROVIDERS:                           SIGNATURES:       Loura Halt MD                    Loura Halt MD

## 2020-04-30 NOTE — Procedures (Deleted)
Sys, Conversion Provider Not In - 11/23/2013  Formatting of this note might be different from the original.  Exam Number:  [id number]                        Report Status:  Final  Type:  MRICervSpnNeWO  Date/Time:  05/26/2012 11:13  Exam Code:  [place]  Ordering Provider:  Gonzella Lex    HISTORY:         39yo pregnant F p/w LUE weakness, decreased sensation, paresthesias, b/l LE paresthesias, facial paresthesias x \E~E\15 hours; headac he. - URGE 21      REPORT     Reviewed by Dr. Charlotta Newton with Dr. Festus Holts. Westley Hummer (Attending        Radiologist)         TECHNIQUE: MRI of the cervical spine without intravenous contrast.         COMPARISON : None.         HISTORY: 39 year old woman, approximately [redacted] weeks pregnant with        inability to move her left arm starting yesterday afternoon, left        face numbness and weakness in left leg. There is associated left         frontal temporal headache.         FINDINGS:         Study is suboptimal secondary to motion artifact, particularly on the        axial T2-weighted imaging.         Vertebral alignment is normal.         Vertebral body height is preserved.          No spinal cord signal abnormality is evident.         The visualized portions of the brain are unremarkable.         The paraspinal soft tissues are unremarkable.         SIGNIFICANT FINDI NGS BY LEVEL:         C2-3: No posterior disc abnormality, spinal canal stenosis, or        neuroforaminal stenosis.         C3-4: No posterior disc abnormality, spinal canal stenosis, or        neuroforaminal stenosis.         C4-5: There is mild right-sided neuroforaminal stenosis related to        uncovertebral degenerative changes. No left-sided neuroforaminal        stenosis, spinal canal stenosis, or posterior disc abnormality.          C5-6: No posterior disc abnormality, spinal canal stenosis, or        neuroforaminal stenosis.         C6-7: No posterior disc abnormality, spinal canal stenosis, or         neuroforaminal stenosis.         C7-T1:No posterior disc abnormality, spinal canal stenosis, or        neuroforaminal stenosis.         IMPRESSION:         Minimal degenerative type changes at C4-C5, with otherwise normal        cervical spine MRI.            S tudy is suboptimal secondary to motion artifact, particularly on the        axial T2-weighted imaging.      PROVIDERS:  SIGNATURES:       Charlotta Newton MD                              Hunt Oris MD                     Hunt Oris MD

## 2020-04-30 NOTE — Procedures (Deleted)
Lorinda Creed, MD - 11/14/2018  Formatting of this note might be different from the original.      US PELVIS TRANSABDOMINAL PLUS TRANSVAGINAL, [place] 3D RECONSTRUCTION PELVIC TRANSABD TRANSVAG    TECHNIQUE:  Transabdominal and transvaginal ultrasound imaging of the pelvis was performed. Transvaginal imaging was performed to provide superior visualization of the endometrium and/or adnexa.    3D reconstructions were created on an ind ependent workstation and reviewed.    COMPARISON: US PELVIS TRANSABDOMINAL PLUS TRANSVAGINAL 2019-Apr-23    FINDINGS:    KIDNEYS: Unremarkable.    UTERUS:  The uterus measures 10.4 cm in maximal dimension.  The endometrial echocomplex measures 19 mm. Intrauterine device is in satisfactory position within the endometrial canal    OVARIES/ADNEXA: The right ovary measures 3.3 cm and is normal noting an involuting corpus luteum cyst. The le ft ovary measures 2.5 cm and is normal.    PELVIS:  No free fluid.      IMPRESSION:   IUD in satisfactory position. Otherwise, unremarkable exam.            ATTESTATION: I, Dr. Lorinda Creed as teaching physician, have reviewed the images for this case and if necessary edited the report originally created by Dr. Kris Mouton.

## 2020-04-30 NOTE — Procedures (Deleted)
Sys, Conversion Provider Not In - 11/26/2013  Formatting of this note might be different from the original.  Exam Number:  [id number]                        Report Status:  Final  Type:  Forearm 2 Views  Date/Time:  06/27/2011 16:40  Exam Code:  DFPBH/EBBWN  Ordering Provider:  Donnajean Lopes    HISTORY:         Pain - Swelling - 39 yo pregnant woman s/p trauma to R forearm and wrist.  Very tender along radius please limit views if able g iven pregnancy- thank y      REPORT     Right Forearm         COMPARISON: None         FINDINGS:       No bone, joint, or soft tissue abnormality is seen.         IMPRESSION:       Normal examination        PROVIDERS:                           SIGNATURES:       Campe, Alleen Borne MD                             Hadley Pen, Rockey Situ

## 2020-04-30 NOTE — ED Provider Notes (Signed)
The patient was seen primarily by me. ED nursing record was reviewed. Select prior records as available electronically through the Epic record were reviewed.    History, physical exam, and disposition planning were conducted with an official hospital Spanish interpreter.    ED RN triage:   Triage Documentation     Milas Gain, RN 04/30/2020 08:53             Pt c/o L rib pain worsening past 2 days with L posterior shoulder/neck pain with LUE numbness since yesterday. + grasp LUE. Pt c/o L flank pain since yesterday with painful urination. Denies fever, chills, nausea, cough.              HPI:    Alexis Guzman is a 39 year old female patient who with history of diabetes managed with weekly TRULICITY injection and listed as on metformin but denies taking, self-reported history of kidney stones (last CT June 2021 with unremarkable kidneys).    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    Also feeling suprapubic pain with urination but no new urgency/frequency.    +f/c at night for the last 2 nights since the start of symptoms. +n, some diarrhea, no vomiting.    Does not feel that she can get comfortable.  No alleviating or exacerbating factors to this pain.  Pain has been constant, sore/achy and felt "internally."  Does not feel like previous kidney stone pain.    No sick household contacts.    Head head COVID in April 2020 and has been fully vaccinated.    Had previous similar episode of pain this summer except for location felt more anterior. Review of record shows pt had ED visit 01/04/2020 dx'd with constipation and diverticulosis and heme+ stool; also seen 03/28/2020 at [facility] had CT with neg stones in renal system.    SocHx:  The patient lives with husband and children.  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  - tobacco use: no  - EtOH: soc  - THC: no  - cocaine: no  - heroin/IVDU: no    ROS: Pertinent positives were reviewed as per the HPI above. All other systems were reviewed and are negative.  Sherril Cong  Language of care: Spanish  MRN: [id number]  PCP: Binnie Rail, MD  Mode of arrival to ED: Self.  Chief complaint: Rib Pain, Numbness, and Urinary Symptoms    Past Medical History/Problem list:  Past Medical History:  No date: Diabetes mellitus ([place])  Patient Active Problem List:     COVID-19 virus infection     Abdominal pain, epigastric     Acute viral syndrome     BMI 40.0-44.9, adult (HCC)     Breast pain in female     Dysuria     Other chest pain     Pelvic pain     Cervicalgia     Constipation     Cough     Cyst of right ovary     Dysmenorrhea     Dyspepsia     Elevated LFTs     History of abnormal cervical Pap smear     HSV infection     Internal hemorrhoids     Intractable abdominal pain     Lower  abdominal pain     IUD check up     Left foot pain     Lobular carcinoma in situ (LCIS) of left breast     Low back pain     Mantoux: positive     MDD (major depressive disorder), recurrent episode, moderate (HCC)     Nonintractable migraine     Moderate binge-eating disorder, in full remission     NASH (nonalcoholic steatohepatitis)     Non morbid obesity due to excess calories     Other dysphagia     Pain of right hand     Palpitations     Panic disorder without agoraphobia     Plantar fasciitis of right foot     Prediabetes     Rib pain on left side     Suspected sleep apnea     Urinary tract infection symptoms     Varicose veins     Vitamin D deficiency    Past Surgical History: No past surgical history on file.  Social History:   Social History     Socioeconomic History    Marital status: Married     Spouse name: Not on file    Number of children: Not on file    Years of education: Not on file    Highest education level: Not on file   Occupational History     Not on file   Tobacco Use    Smoking status: Never Smoker    Smokeless tobacco: Never Used   Substance and Sexual Activity    Alcohol use: Yes    Drug use: Never    Sexual activity: Not on file   Other Topics Concern    Not on file   Social History Narrative    Not on file   Social Determinants of Health  Financial Resource Strain:     Difficulty of Paying Living Expenses: Not on file  Food Insecurity:     Worried About [place] in the Last Year: Not on file    Ran Out of Food in the Last Year: Not on file  Transportation Needs:     Lack of Transportation (Medical): Not on file    Lack of Transportation (Non-Medical): Not on file  Physical Activity:     Days of Exercise per Week: Not on file    Minutes of Exercise per Session: Not on file  Stress:     Feeling of Stress : Not on file  Social Connections:     Frequency of Communication with Friends and Family: Not on file    Frequency of Social Gatherings with Friends and Family: Not on file    Attends Religious Services: Not on file    Active Member of Clubs or Organizations: Not on file    Attends Archivist Meetings: Not on file    Marital Status: Not on file  Intimate Partner Violence:     Fear of Current or Ex-Partner: Not on file    Emotionally Abused: Not on file    Physically Abused: Not on file    Sexually Abused: Not on file     Allergies: Review of Patient's Allergies indicates:  No Known Allergies    Immunizations:   Immunization History   Administered Date(s) Administered    Covid-19 Vaccine [employer]) 11/02/2019, 11/23/2019          Medications:  TRULICITY per [facility] records     Physical Exam (ED Bed 07/07-A):  Patient Vitals for the past 999 hrs:   Temp Weight   04/30/20 0848 98.6 F 90.7 kg (200 lb)     GENERAL:  WDWN, no acute distress, non-toxic but uncomfortable  SKIN:  Warm & Dry, no rash, no petechiae or purpurae.  HEAD:  NCAT. Sclerae are anicteric and aninjected.  NECK:  Supple, no  LAN.  LUNGS:  Clear to auscultation bilaterally. No wheezes, rales, rhonchi.   HEART:  RRR.  No murmurs, rubs, or gallops.   ABDOMEN:  Soft, ND.  TTP along left anterior and lateral flank.  No masses.  No involuntary guarding or rebound.   EXTREMITIES:  No obvious deformities. No cyanosis. No edema.  GENITOURINARY:  +L CVA tenderness.  NEUROLOGIC:  Alert; moves all extremities; speaking in clear fluent sentences. Normal gait without ataxia.   PSYCHIATRIC:  Appropriate for age, time of day, and situation    Medications Given in the ED:    Medications   sodium chloride 0.9 % IV bolus 1,000 mL (has no administration in time range)   ketorolac (TORADOL) injection 30 mg (has no administration in time range)   famotidine (PEPCID) injection 20 mg (has no administration in time range)   ondansetron (ZOFRAN) injection 4 mg (has no administration in time range)    Radiology Results:  See ED COURSE   Lab Results:     Labs Reviewed   POC URINALYSIS - Abnormal; Notable for the following components:       Result Value    CLARITY SL CLOUDY (*)     KETONE, URINE TRACE (*)     UROBILINOGEN URINE 1.0 (*)     All other components within normal limits   CBC, PLATELET & DIFFERENTIAL   COMPREHENSIVE METABOLIC PANEL   MAGNESIUM   LIPASE   TROPONIN I   URINALYSIS   URINE PREGNANCY TEST (POINT OF CARE)        ED WET READS:  No orders to display    [place] RADIOLOGY READS:  No orders to display    Other Results and OLD/PRIOR records information and data (e.g. ECG, visual acuity):  See ED COURSE     ED Course and Medical Decision-making:    ED Course as of May 01 1442   Sat Apr 30, 2020   1540 Spoke with pt by phone  [ ]  work note left at front desk  [ ]  e-prescribed famotidine to [facility] on [address] Reviewed his results with patient and discussed possible passed stone, GERD/gastritis, constipation as cause of the pain.  Patient notes that she is feeling significant improvement.  On reexam, abdomen is soft with slight  discomfort on palpation of LUQ only; no further left-sided CVAT  [ ]  mag citrate  [ ]  famotidine, gi cocktail, tums, apap  [ ]  trop #2      1120 No acute intra-abdominal process.   No hydronephrosis or nephrolithiasis.   CT Abdomen/Pelvis Stone Protocol   1120 No acute cardiopulmonary findings.      XR Chest 2 views   1052 My reading: Sinus today for, slight LAD, no ST elevation or depression, T wave flattening/inversion in precordial leads, QTC 420.  Compared to June 2021, T wave flattening is new but compared to March 2020, no changes   EKG : Initial   0947 39yoF pt with DM and self-reported h/o kidney stones (but none on June 2021 CT abd/pelvis) with 2 days L flank/LUQ pain.  -  px exam with L ant flank ttp, L CVAT  - ddx renal colic, GERD/gastritis, colitis, diverticulitis  Plan:  [ ]  ecg  [ ]  ct abd/pelvis  [ ]  cxr  [ ]  cbc, cmp, mg, lipase, trop, ua  [ ]  ns, ondansetron, ketorolac, famotidine          Patient/family educated on differential/diagnosis(es); she states understanding and agreement with plan of care.  Reasons to return to the ED were reviewed in detail. Patient and/or family agrees with this plan and disposition.    Disposition: Discharge    Condition on Discharge: Improved and Stable    Diagnosis/Diagnoses:  Acute LUQ pain  Acute left flank pain  Constipation, unspecified constipation type    Discharge Prescriptions:   START taking these medications    famotidine 20 MG tablet  Commonly known as: PEPCID  Take 1 tablet by mouth 2 (two) times daily         Woodson Macha Lai-Becker MD Wilber Oliphant  This Emergency Department patient encounter note was created using voice-recognition software and in real time during the ED visit.

## 2020-04-30 NOTE — Procedures (Deleted)
Andrey Cota, MD, PhD - 09/20/2017  Formatting of this note might be different from the original.  CT ABDOMEN/PELVIS WITH CONTRAST    TECHNIQUE:   CT of the abdomen and pelvis WITH intravenous contrast.     COMPARISON: CT ABDOMEN/PELVIS WITH CONTRAST 04/28/2017.    FINDINGS:  LOWER THORAX: Normal.    HEPATOBILIARY: No focal hepatic lesions. Hepatic steatosis. No biliary ductal  dilatation. Cholecystectomy.  SPLEEN: No splenomegaly .  PANCREAS: No focal masses or ductal dilatation.    ADRENALS: No adrenal nodules.  KIDNEYS/URETERS: No hydronephrosis, stones, or solid mass lesions.  PELVIC ORGANS/BLADDER: IUD in situ, in satisfactory position. No adnexal masses.    PERITONEUM / RETROPERITONEUM: No free air or fluid.  LYMPH NODES: No lymphadenopathy.  VESSELS: Unremarkable.    GI TRACT: No distention or wall thickening.    BONES AND SOFT TISSUES: Unremarkable.    IM PRESSION:   No CT evident abdominopelvic findings to account for this patient's symptoms.      ATTESTATION: I, Dr. Argentina Ponder as teaching physician, have reviewed the images  for this case and if necessary edited the report originally created by Dr. Lanice Schwab.

## 2020-04-30 NOTE — Procedures (Deleted)
System, Provider Not In, PhD - 02/02/2014  Formatting of this note might be different from the original.  Exam Number:  [id number]                        Report Status:  Final  Type:  Ankle Min 3 Views  Date/Time:  02/02/2014 15:43  Exam Code:  [id number]/JJAA  Ordering Provider:  Grant Ruts MD    HISTORY:         Pain - Stiffness - Please include proximal 5th MT.  Pain anterior to lateral malleolus and along 5th MT after fall at work , inversion injury on 01/20/2014  hard to bear weight.      REPORT     Left Ankle  - minimum 3 views         COMPARISON: Left ankle radiographs 07/05/2008.         FINDINGS:       No bone, joint, or soft tissue abnormality is seen. The ankle mortise        is symmetric. Incidentally noted is a tiny plantar calcaneal spur.         IMPRESSION:        No acute osseous abnormality            PROVIDERS:                           SIGNATURES:        Guadlupe Spanish MD          Guadlupe Spanish MD

## 2020-04-30 NOTE — Procedures (Deleted)
Sys, Conversion Provider Not In - 09/10/2013  Formatting of this note might be different from the original.  Exam Number:  [id number]                        Report Status:  Final  Type:  OBUS-BioPhysical Prof Single  Date/Time:  10/29/2012 13:36  Exam Code:  [id number]  Ordering Provider:  Norval Morton  Associated Reports:       [id number]:  OBUS Limited OB Ultrasound  ------------------------------------------------------------------ ------------    HISTORY:         BPP ONLY HX IUFD @ 35.6 -       REPORT     Requester:  Norval Morton         INDICATION:History of IUFD, increased risk for stillbirth.  BPP        without measurements         Examination of the uterus reveals a single active fetus in vertex        presentation with a posterior placenta.  The fetal heart rate and        amniotic fluid volume appear normal.          The biophysical profile was 8/ 8.         ak      PROVIDERS:                           SIGNATURES:       Loura Halt MD                    Loura Halt MD

## 2020-04-30 NOTE — Procedures (Deleted)
Sys, Conversion Provider Not In - 11/23/2013  Formatting of this note might be different from the original.  Exam Number:  [id number]                        Report Status:  Final  Type:  OBUS<[id number]-ERA  Date/Time:  05/27/2012 15:33  Exam Code:  [id number]  Ordering Provider:  Kendra Opitz    HISTORY:         VIABILITY, DATING -       REPORT     Requester:  Darrol Poke. Battel         INDICATION:  Uncertain Dates         Examination o f the pelvis reveals a single normally-shaped        gestational sac containing a sonographically normal embryo with a        normal heart rate inside a normal appearing uterus.  It is too early        to assess the placenta or the amniotic fluid volume.  The ovaries        appear normal bilaterally.  No adnexal masses are seen.         Biometry is consistent with 9 weeks and 4 days, based on today's        scan, with an [facility] of  June 13 , 2014.         The patient was to see her provider immediately after the scan and        this report will be made available.         js      PROVIDERS:                           SIGNATURES:       Lindi Adie MD              Lindi Adie MD

## 2020-04-30 NOTE — Procedures (Deleted)
[name], MD - 09/16/2017  Formatting of this note might be different from the original.      US PELVIS TRANSABDOMINAL PLUS TRANSVAGINAL, [place] 3D RECONSTRUCTION PELVIC TRANSABD  TRANSVAG    TECHNIQUE:  Transabdominal and transvaginal ultrasound imaging of the pelvis was performed.  Transvaginal imaging was performed to provide superior visualization of the  endometrium and/or adnexa.    3D reconstructions were created on an indepe ndent workstation and reviewed.    COMPARISON: CT ABDOMEN/PELVIS WITH CONTRAST 04/28/2017.    FINDINGS:    KIDNEYS: Unremarkable.    UTERUS:  The uterus measures 10.9 x 4.7 x 6.2 cm. Few cervical nabothian cysts.  The endometrial echocomplex measures 1.5 cm. An IUD is visualized in an  appropriate position within the endometrial cavity.    OVARIES/ADNEXA: Unremarkable    PELVIS:  No free fluid.    Incidentally noted hepatic steatosis.       IMPRESSION:   An IUD is visualized in appropriate position within the endometrial cavity.    No adnexal masses.    Incidentally noted hepatic steatosis.            ATTESTATION: I, Dr. [name] as teaching physician, have reviewed the  images for this case and if necessary edited the report originally created by  Dr. Pola Corn.

## 2020-04-30 NOTE — Discharge Instructions (Signed)
Departed without instructions

## 2020-04-30 NOTE — Procedures (Deleted)
Sys, Conversion Provider Not In - 11/24/2013  Formatting of this note might be different from the original.  Exam Number:  [id number]                        Report Status:  Final  Type:  OBUS Limited OB Ultrasound  Date/Time:  01/11/2012 16:14  Exam Code:  8768TL  Ordering Provider:  Carmell Austria  Associated Reports:     [id number]:  OBUS Transvaginal Ultrasound  --------------------------------------------------------------------- ---------    HISTORY:         viability check -       REPORT     Requester:  Carmell Austria         INDICATION:  Bleeding         Examination of the pelvis reveals a normal uterus with a probable        intrauterine gestational sac and possible yolk sac.  The ovaries        appear normal bilaterally.         The transvaginal approach was used in the evaluation of the pelvic        anatomy because adequate transabdominal images cou ld not be obtained.        The sonographer was chaperoned during this portion of the exam.         Because we cannot clearly see the yolk sac, the differential        diagnosis still includes early viable intrauterine pregnancy, early        pregnancy failure, and ectopic pregnancy.           A follow up ultrasound in 10 to 14 days or quantitative HCGs should        be considered as clinically indicated.         sm      PROVIDERS:                            SIGNATURES:       Darlin Drop MD                 Darlin Drop MD

## 2020-04-30 NOTE — Procedures (Deleted)
Sys, Conversion Provider Not In - 09/10/2013  Formatting of this note might be different from the original.  Exam Number:  [id number]                        Report Status:  Final  Type:  OBUS Limited OB Ultrasound  Date/Time:  11/05/2012 11:03  Exam Code:  [id number]  Ordering Provider:  Norval Morton  Associated Reports:     [id number]:  OBUS-BioPhysical Prof Single  -------------------------------------------------------------------- ----------    HISTORY:         BPP ONLY HXIUFD _0 .6 -       REPORT     Requester:  Norval Morton         INDICATION:  Maternal risk factor: HX of IUFD, increased risk for        stillbirth.  BPP without measurements         Examination of the uterus reveals a single active fetus in vertex        presentation with a posterior placenta.  The fetal heart rate and        amniotic fluid volume appear normal.          The biophysical  profile was 8/8.         MMR      PROVIDERS:                           SIGNATURES:       Loura Halt MD                    Loura Halt MD

## 2020-04-30 NOTE — Procedures (Deleted)
Sys, Conversion Provider Not In - 11/25/2013  Formatting of this note might be different from the original.  Exam Number:  [id number]                        Report Status:  Final  Type:  OBUS Follow Up Single Fetus  Date/Time:  10/12/2011 09:09  Exam Code:  [id number]  Ordering Provider:  Thomasenia Bottoms, Christiani R.    HISTORY:         bmi greater than 30 -       REPORT     Requester:  Theadora Rama GuerreroGatto         INDICATION:  Materna l risk factor:  BMI > 30         Examination of the uterus reveals a fetal demise.  There is a single        fetus in vertex presentation but there is no fetal motion and no        fetal heart motion. The placenta is anterior. Theamniotic fluid        volume appears normal.          The cervix could not be visualized.           Biometry corresponds to 2800 grams which is about the 40 percentile        for 36 weeks and 3 days, based on p rior ultrasound with an EDC of        11/06/2011.           The demise appears to be very recent as there is minimal maceration,        and the fetus does not appear to be small.  The providers are with        the patient now.         NC      PROVIDERS:                           SIGNATURES:       Ollen Gross

## 2020-04-30 NOTE — Procedures (Deleted)
Alexis Guzman, MBBCh BAO - 12/29/2014  Formatting of this note might be different from the original.  CT scan of the chest WITHOUT intravenous contrast, using low-dose protocol.    COMPARISON: Head CT 11/25/2014.    FINDINGS:  Lines/tubes:  None.    Lungs and Airways:  The central airways are patent. There is a 4 mm nodule in  the left upper lobe on image 31, unchanged from the prior study. There is  calcified granuloma in the righ t apex.    Pleura: The pleural spaces are clear.    Heart and mediastinum: The thyroid gland is normal.  No significant mediastinal,  or hilar lymphadenopathy is seen. The heart and pericardium are within normal  limits.     Soft tissues: There is no significant axillary or subpectoral lymphadenopathy.    Abdomen: This study was performed without contrast and at lower than standard  dose as part of the pulmonary nodule follow-up protoco l.  These factors reduce  the sensitivity for detection of small lesions in the upper abdomen.  Given  these technical limitations, no focal lesion is seen within the visualized  spleen, pancreas, kidneys and adrenal glands. There is diffuse fatty  infiltration of the liver. The patient is status post cholecystectomy.    Bones: There are mild degenerative changes in the visualized spine. There are no  suspicious lytic or blastic lesions .    IMPRESSION:   IMPRESSION:   4 mm left upper lobe nodule unchanged dating back to May 2016, however remains  indeterminate.    Fatty liver.    RECOMMENDATION:  If the patient is low risk for malignancy, no follow up CT is necessary. If  there is a history of smoking, Chest CT in 12 months. If there is a history of  malignancy within the past 5 years, follow-up chest CT in 3 months.    This report has been forwarded to an automated c ommunication system which will  electronically notify appropriate providers of potentially important findings.

## 2020-04-30 NOTE — Procedures (Deleted)
Sys, Conversion Provider Not In - 11/26/2013  Formatting of this note might be different from the original.  Exam Number:  [id number]                        Report Status:  Final  Type:  OBUS < 14 Weeks Single Fetus W/O ERA  Date/Time:  03/29/2011 09:02  Exam Code:  [id number]  Ordering Provider:  Mcarthur Rossetti MD MD    HISTORY:         Katheran James, TOLD FLUID IN LEFT OVARY @ [facility], PAIN IN LEFT SIDE -       REPORT     Requester:  Mcarthur Rossetti MD, MD         INDICATION:  Uncertain Dates         Examination of the pelvis reveals a single normally-shaped        gestational sac containing a sonographically normal embryo with a        normal heart rate inside a normal appearing uterus.  It is too early        to assess the placenta or the amniotic fluid volume.  The ovaries        appear normal bilaterally.  No adnexal masses are seen.         Biometry is consistent with 8  weeks and 2 days, based on today's        scan, with an [facility] of November 06, 2011.         BEM      PROVIDERS:                           SIGNATURES:       Lindi Adie MD              Lindi Adie MD

## 2020-04-30 NOTE — Procedures (Deleted)
Alexis Guzman, Alexis Guzman, MBBS, MD - 10/15/2017  Formatting of this note might be different from the original.  XR ABDOMEN 1 VIEW    Single AP view of the abdomen    COMPARISON: KUB 12/20/2013.    IMPRESSION:   Moderate fecal loading. Nonobstructive bowel gas pattern. No free air. IUD in  situ. Cholecystectomy clips in the right upper quadrant.                ATTESTATION: I, Dr. Margaretmary Bayley as teaching physician,  have  reviewed the images for this case and if necessary edited the report originally  created by Dr. Rowland Lathe.

## 2020-04-30 NOTE — Procedures (Deleted)
System, Provider Not In, PhD - 12/21/2013  Formatting of this note might be different from the original.  Exam Number:  [id number]                        Report Status:  Final  Type:  Abdomen Single  AP View  Date/Time:  12/20/2013 11:14  Exam Code:  NATFT7  Ordering Provider:  Juluis Rainier    HISTORY:         Hematuria -       REPORT     Single AP view of the abdomen         COMPARISON: Pelvic ultrasound 05/09/2012.         IMPRESSIO N:       Poor visualization of the small bowel loops         Stool is noted in the colon         No definite stones in the kidneys are noted         There is IUD device in place         Recommendation         If hematuria persists a CT may be helpful for further evaluation                PROVIDERS:                           SIGNATURES:       Matilde Bash MD                    Matilde Bash MD

## 2020-04-30 NOTE — Procedures (Deleted)
System, Provider Not In, PhD - 02/02/2014  Formatting of this note might be different from the original.  Exam Number:  [id number]                        Report Status:  Final  Type:  Wrist Complete Min 3 Views  Date/Time:  02/02/2014 15:43  Exam Code:  ULAGT3/MIWOE  Ordering Provider:  Grant Ruts MD  Associated Reports:       [id number]:  Hand Minimum 3 Views  ------------------------------------------------------------------------ ------    HISTORY:         Pain - Swelling - Pain/swelling and some warmth dorsal radial side of hand and wrist  struck hand in a fall at work on 01/20/2014.  Please include distal radius/ulna      REPORT     Right wrist Complete Min 3 Views 02/02/2014, right hand Minimum 3        Views 02/02/2014.         COMPARISON: None         FINDINGS:       No displaced fracture or malalignment. There is soft tissue swelling.         IMPRESSION:         No displaced fracture            PROVIDERS:                           SIGNATURES:       Guadlupe Spanish MD          Guadlupe Spanish MD

## 2020-04-30 NOTE — Procedures (Deleted)
Sys, Conversion Provider Not In - 11/24/2013  Formatting of this note might be different from the original.  Exam Number:  [id number]                        Report Status:  Final  Type:  OBUS Limited OB Ultrasound  Date/Time:  01/02/2012 13:38  Exam Code:  [id number]  Ordering Provider:  Mcarthur Rossetti MD MD  Associated Reports:     [id number]:  OBUS Transvaginal Ultrasound  ------------------------------------------------------------------ ------------    HISTORY:         VIABILITY CK- HX OF STILLBIRTH 10/12/11- NOW RISING HCGS 6/17= 1263 -       REPORT     Requester:  Mcarthur Rossetti MD, MD         INDICATION:  Unknown dates         Examination of the pelvis reveals a normal  size uterus with an        irregular fluid collection within the uterus.   There is a 2cm        sonolucent cyst on the right ovary. The left ovries appear normal.        There is no free fluid.           The transvaginal approach was used in the evaluation of the pelvic        anatomy because adequate transabdominal images could not be obtained.        The sonographer was chaperoned during this portion of the exam.         In the setting of a positive betasubunit, an ectopic pregnancy is not        excluded.         Correlation with clinical circumstance, serial hCG and possibly        follow up ultrasounds are recommended.             The patient was seen by the triage nurse immediately after the scan.         jr      PROVIDERS:                           SIGNATURES:       Loura Halt MD                    Loura Halt MD

## 2020-04-30 NOTE — Procedures (Deleted)
Sys, Conversion Provider Not In - 09/11/2013  Formatting of this note might be different from the original.  Exam Number:  [id number]                        Report Status:  Final  Type:  OBUS Limited OB Ultrasound  Date/Time:  09/17/2012 17:00  Exam Code:  [id number]  Ordering Provider:  Norval Morton    HISTORY:         26+ wks preg. c/o decreased fm and hx iufd- limited fetal moments -       REPORT     Requester:  Arvin Collard irth         INDICATION:  Maternal risk factor: Decreased Fetal Movement         Examination of the uterus reveals a single active fetus in breech        presentation with a posterior placenta.  The fetal heart rate and        amniotic fluid volume appear normal.          The patient was to see her provider immediately after the scan and        this report will be made available.         js      PROVIDERS:                           SIGN ATURES:       Loura Halt MD                    Loura Halt MD

## 2020-04-30 NOTE — Procedures (Deleted)
Sys, Conversion Provider Not In - 11/25/2013  Formatting of this note might be different from the original.  Exam Number:  [id number]                        Report Status:  Final  Type:  Chest Single View  Date/Time:  08/17/2011 14:35  Exam Code:  MCEY2  Ordering Provider:  Thomasenia Bottoms, Christiani R.    HISTORY:          - +PPD      REPORT     A single AP view of the chest.         COMPARISON: Single view chest 12/31/2005.         FINDIN GS:       Lines/tubes:  None.         Lungs:  The lungs are well inflated and clear. There is no evidence        of pneumonia or pulmonary edema.         Pleura:  There is no pleural effusion or pneumothorax.         Heart and mediastinum:  The heart and the mediastinum are stable.         Bones:  The thoracic skeleton is unremarkable. Surgical clips in the        right upper quadrant from cholecystectomy.         IMPRESSION:       Clea r lungs        PROVIDERS:                           SIGNATURES:       Achilles Dunk MD                Achilles Dunk MD

## 2020-04-30 NOTE — Narrator Note (Signed)
Pt laying semi fowler on str, alert c/o improved pain to L rib. Admin medications per [facility] - pt tolerated well. Saline continues to admin via IV.

## 2020-04-30 NOTE — Procedures (Deleted)
Alexis Ludwig, MD - 01/24/2015  Formatting of this note might be different from the original.  HISTORY 39 yo female presents with pain in the left breast. FILMS COMPARED There are no previous studies available at this time. LEFT BREAST BREAST MRI HISTORY:Patient seen for diagnostic MRI. TECHNIQUE:The patient was placed prone in a bilateral dedicated 8 channel open breast coil (GE) and the following series were obtained with a 1. 5 Tesla MR scanner (GE): 3-plane localizer; axial non fat suppressed T1; axial fat suppressed T2, one pre contrast and two post-contrast axial dynamic fat suppressed T1.  Contrast was delivered as a power-injected (2 cc/sec) weight-adjusted (0.1 mm/kg) dose of gadolinium followed by a 15 cc saline flush. Multi-planar reconstructions in sagittal and coronal orientations were created of the immediate post-contrast T1-weighted images.  Sub traction images were created by subtracting the pre-contrast from immediate post-contrast T1-weighted images, and were presented both in the axial plane and in 3D reconstructions (maximum intensity projections). Enhancement kinetics (initial and delayed) were evaluated for the dynamic phase series and displayed with color overlay maps. Initial enhancement thresholds of 50% increase for medium uptake and 100% increase for rapid uptake we re applied, and delayed curve types were defined as washout for 10% or greater decrease in signal intensity, plateau for less than 10% change in signal intensity, and persistent enhancement for 10% or greater increase in signal intensity. FINDINGS:There is extremely dense fibroglandular tissue. There is marked background  parenchymal enhancement, which limits evaluation of the breasts. There is a round enhancing mass with circumscribed  margins measuring 13 x 13 x 11 mm in the lower inner quadrant of the left breast at posterior depth.  There are nonenhancing internal septations. The mass is increased in signal on T2  weighted images.  Kinetic analysis of the mass demonstrates a rapid initial rise with persistent enhancment in the delayed phase. The morphology and kinetics of the mass are suggestive of a fibroadenoma. There is no MRI evidence of malignancy in either bre ast. There is no abnormality in either axilla, chest wall or nipple areolar complex. Images were also reviewed by Dr. Billy Coast.   IMPRESSION:   IMPRESSION:1. Round mass in the lower inner quadrant of the left breast at posterior depth with morphology and kinetics suggestive of fibroadenoma.2. No MR evidence of malignancy in either breast. RECOMMENDATION:If the pain in the left breast is focal, a target ultrasound can be consider ed for further evaluation.  If the pain in the left breast is non-focal, clinical management is recommended.  BI-RADS Category 2: Benign Finding  Because findings in this report may be important, an automated tracked email will be sent to the referring physician upon report finalization by the attending Radiologist. Patient's information is entered into a reminder system with a target due date for the next exam. We support the American  Cancer Society guidelines for early breast cancer detection in women at increased risk for breast cancer.  These guidelines recommend breast MRI in combination with mammography for women at 20% or greater lifetime risk of breast cancer.

## 2020-04-30 NOTE — Procedures (Deleted)
Sys, Conversion Provider Not In - 11/28/2013  Formatting of this note might be different from the original.  Exam Number:  [id number]                        Report Status:  Final  Type:  Ultrasound Abdomen  Date/Time:  08/28/2010 09:50  Exam Code:  WGNFA  Ordering Provider:  Arlington Calix MD    HISTORY:         RUQ EPIGASTRIC PAIN SINCE YESTERDAY, ASSESS FOR CHOLECYSTITIS -       REPORT     TECHNIQUE:       Ultrasound evaluation of the  abdomen.  Volumetric sweeps were        obtained and reviewed.         COMPARISON: CT abdomen dated 03/31/2007         FINDINGS:         LIVER: There is diffuse increased echogenicity of the liver        consistent with hepatic steatosis.  No focal lesions.         BILIARY:                 Gallbladder: The gallbladder is distended and demonstrates        presence of multiple mobile echogenic calculi within it which        demonstrate pos terior acoustic shadowing.  The gallbladder wall is        not thickened, there is no pericholecystic fluid, and sonographic        Murphy sign is negative.                Common bile duct measures 1 cm in diameter and is uniform in        caliber up to the intrapancreatic portion.  No definite calculi seen        within the CBD.         PANCREAS:  The visualized portion of the head and body appear normal.         SPLEEN: No splenomegal y.         PERITONEUM:  No free fluid.         KIDNEYS: Normal size bilaterally.  No hydronephrosis.  No        sonographically evident solid mass lesion.         MIDLINE VASCULATURE:  The visualized IVC is patent.   Portal vein is        patent. The maximum visualized aortic diameter is 2.1 cm.           IMPRESSION:        1. Cholelithiasis without evidence of acute cholecystitis.         2. Hepatic steatosis.        PROVIDERS:                            SIGNATURES:       Shenoy-Bhangle, Evlyn Courier MD                  Harisinghani, Mariel Aloe MD               Lorinda Creed MD

## 2020-04-30 NOTE — Procedures (Deleted)
Sys, Conversion Provider Not In - 11/24/2013  Formatting of this note might be different from the original.  Exam Number:  [id number]                        Report Status:  Final  Type:  OBUS Transvaginal Ultrasound  Date/Time:  01/02/2012 13:38  Exam Code:  [id number]  Ordering Provider:  Mcarthur Rossetti MD MD  Associated Reports:       [id number]:  OBUS Limited OB Ultrasound  ---------------------------------------------------------------- --------------    HISTORY:         VIABILITY CK- HX OF STILLBIRTH 10/12/11- NOW RISING HCGS 6/17= 1263 -       REPORT     Requester:  Mcarthur Rossetti MD, MD         INDICATION:  Unknown dates         Examination of the pelvis reveals a normal  size uterus with an        irregular fluid collection within the uterus.   There is a 2cm        sonolucent cyst on the right ovary. The left ovries appear normal.        There is no free fluid.           The transvaginal approach was used in the evaluation of the pelvic        anatomy because adequate transabdominal images could not be obtained.        The sonographer was chaperoned during this portion of the exam.         In the setting of a positive betasubunit, an ectopic pregnancy is not        excluded.         Correlation with clinical circumstance, serial hCG and possibly        follow up ultrasounds are recommended.             The patient was seen by the triage nurse immediately after the scan.         jr      PROVIDERS:                           SIGNATURES:       Loura Halt MD                    Loura Halt MD

## 2020-04-30 NOTE — Procedures (Deleted)
Alexis Hope, MD - 11/25/2014  Formatting of this note might be different from the original.  TECHNIQUE:   CTA of the head, including non-contrast and post-contrast images, and image  post-processing.  CTA neck with contrast.    COMPARISON: Head MRI dated 05/26/2012.    FINDINGS:     HEAD CT:    The brain parenchyma demonstrates no significant abnormality.    There is no acute intracranial hemorrhage, infarction or mass lesion. There  is  no abnormal intracranial enhancement.    The ventricles, sulci and cisterns are within normal limits in size and  configuration. No evidence of hydrocephalus.     The visualized paranasal sinuses and mastoid air-cells are well aerated and  clear. The bones, orbits and extracranial soft tissues are unremarkable.    HEAD CTA:    No intracranial aneurysm is identified.    The intracranial internal carotid arteries demonstrate no signif icant stenosis.    The anterior, middle and posterior cerebral arteries demonstrate no significant  stenosis.    The intracranial vertebral arteries and the basilar artery demonstrate no  significant stenosis.    The left posterior communicating artery and an anterior communicating artery are  present. The right posterior communicating artery is not visualized. There is  infundibular origin of the left posterior communicating artery.     The major intracranial venous structures appear normal, without evidence of  thrombosis.    NECK CTA:    Image quality is degraded by artifact related to patient motion.    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 There is no appreciable  stenosis involving the brachiocephalic artery or proximal subclavian arteries.    The left common and internal carotid arteries demonstrate no significant  stenosis.    The right common and in ternal carotid arteries demonstrate no significant  stenosis.    The cervical segments of the vertebral arteries demonstrate no significant  stenosis.    There are scattered bilateral  prominent cervical lymph nodes without suspicious  features, possibly reactive in etiology. The soft tissues of the neck are  otherwise symmetric and within normal limits.    The cervical spine is unremarkable.    No evidence of pneumonia or pulmonary edem a. Incidental note is made of an  indeterminant 4 mm pulmonary nodule at the left lung apex. There is a calcified  granuloma at the right lung apex.    IMPRESSION:   IMPRESSION:    1.  No acute intracranial hemorrhage, large infarct or large intracranial mass.    2.  No significant stenosis in the major cervical or intracranial arteries.    3.  Indeterminate 4 mm nodule at the left lung apex.   Chest CT follow up in 1  year can be consi dered depending on the patient's risk factors (e.g. prior  smoking).      These findings were discussed with Dr. Annice Needy at the time of image  acquisition, 11/25/2014 2:45 PM, who responded indicating the communication was  understood.    ==================================  CAROTID STENOSIS REFERENCE - distal internal carotid artery diameter as the  denominator for stenosis measurement:  MILD = <50% stenosis.  MODERATE =  50-69%  stenosis.  SEVERE =  70-89% stenosis.  HAIRLINE/CRITICAL = 90-99% stenosis.  OCCLUDED = 100% stenosis.  ==================================

## 2020-04-30 NOTE — Procedures (Deleted)
System, Provider Not In, PhD - 07/27/2014  Formatting of this note might be different from the original.  Exam Number:  [id number]                        Report Status:  Final  Type:  Foot Comp Min 3 Views  Date/Time:  07/27/2014 11:01  Exam Code:  XRFT2/LEFT  Ordering Provider:  Ron Agee MD    HISTORY:         Deformity, acquired - Joint Pain - Swelling of extremity - 39 yo F with L 5th toe pain and deformity overriding, + i nflammation and tenderness      REPORT     Left Foot  - minimum 3 views         COMPARISON: Left ankle radiographs 02/02/2014.         FINDINGS:       Osseous alignment is normal. Mild degenerative change at the first        MTP joint. No evidence of fracture or dislocation. Bipartite medial        sesamoid.         IMPRESSION:        No evidence of fracture or dislocation.         Radiographically unremarkable fifth toe.            PROV IDERS:                           SIGNATURES:       Johny Drilling MD                   Johny Drilling MD

## 2020-04-30 NOTE — Procedures (Deleted)
Lanier Ensign, MD, PhD - 11/19/2017  Formatting of this note might be different from the original.  HISTORY:  39 year old female seen for diagnostic evaluation of focal pain in the left breast.    PROCEDURE:  Diagnostic mammographic and tomosynthesis views were obtained.    LEFT BREAST MAMMOGRAM:  The present study is compared to previous imaging.  The breast tissue is extremely dense. This may lower the sensitivity of mammography.     There are architectural changes involving the left breast which are consistent with previous surgery.    BREAST ULTRASOUND  A focused ultrasound was performed of the left breast.  Sonography was performed in the left breast in the area of the area of clinical concern as indicated by the patient at 4 o'clock 5 cm from the nipple in transverse and longitudinal planes.  No specific ultrasound finding identified to account for patient sym ptoms of focal left breast pain.    IMPRESSION:   Post surgical changes with no specific mammographic evidence of malignancy.    No specific imaging finding to account for patient symptoms of focal pain in the left breast. In the absence of imaging findings any decision for further intervention, at this time, should be based on the clinical assessment.    BI-RADS Category 2: Benign Finding

## 2020-04-30 NOTE — Procedures (Deleted)
Kelli Hope, MD - 11/25/2014  Formatting of this note might be different from the original.  TECHNIQUE: MRI of the brain without intravenous contrast    COMPARISON: Head and Neck CTA dated 11/25/2014 at 2:30 PM; Head MRI dated  05/26/2012.    FINDINGS:     The brain parenchyma demonstrates no significant abnormality.    There is no acute intracranial hemorrhage, infarction or mass lesion.    The ventricles, sulci and cisterns are with in normal limits in size and  configuration. No evidence of hydrocephalus. The flow voids of the major  intracranial vessels appear intact.    The visualized paranasal sinuses and mastoid air-cells are clear.     The bones, orbits and extracranial soft tissues are unremarkable.    IMPRESSION:   IMPRESSION:    No acute intracranial hemorrhage, infarction or mass lesion.

## 2020-04-30 NOTE — Procedures (Deleted)
Husseini, Birdena Crandall, MD - 12/25/2017  Formatting of this note might be different from the original.  TECHNIQUE: XR RIBS 3 OR MORE VIEWS WITH PA CHEST (LEFT)    COMPARISON: None.    FINDINGS:  No displaced left rib fracture. No pneumothorax. The lungs are clear. Mild  levoconvex curvature of the thoracolumbar spine. Cholecystectomy clips project  over the right upper quadrant.    IMPRESSION:   No displaced left rib fracture.

## 2020-04-30 NOTE — Procedures (Deleted)
Albertina Parr, MD - 03/17/2019  Formatting of this note might be different from the original.  INDICATION FOR EXAM:  Screening.    PROCEDURE:  The following standard mammographic and tomosynthesis views were obtained: craniocaudal and mediolateral oblique. Computer-aided detection was utilized by the radiologist in the interpretation of this examination.    BILATERAL MAMMOGRAM:  The present study is compared to previous imagi ng.  The breast tissue is heterogeneously dense, which could obscure detection of small masses.    There are architectural changes involving the left breast which are consistent with previous surgery.    No suspicious masses, calcifications or other abnormalities are seen in either breast.    IMPRESSION:   Post surgical changes with no specific mammographic evidence of malignancy.    BI-RADS Category 2: Benign Finding    Patient's infor mation is entered into a reminder system with a target due date for the next mammogram.

## 2020-04-30 NOTE — Procedures (Deleted)
Dontchos, Alexis Boatman, MD - 01/15/2017  Formatting of this note might be different from the original.  HISTORY:  39 year old female seen for diagnostic evaluation of lumps in the left breast.    PROCEDURE:  Diagnostic mammographic and tomosynthesis views were obtained. Computer-aided detection was utilized by the radiologist in the interpretation of this examination.    BILATERAL MAMMOGRAM:  The present study is compared to previous imaging .  The breast tissue is extremely dense. This may lower the sensitivity of mammography.    Finding 1:  There is no mammographic abnormality in  the area of clinical concern as indicated by the patient in the axilla.    Finding 2:  There is no mammographic abnormality in  the area of clinical concern as indicated by the patient in the left breast at 6 o'clock located 3 centimeters from the nipple.    Incidental 6 mm circumscribed mass is  seen in the medial left breast, seen only on CC view.    No suspicious masses, calcifications or other abnormalities are seen in the right breast.    BREAST ULTRASOUND  Finding 1:  A focused ultrasound was performed of the axilla.  No suspicious sonographic abnormality is present in the area of clinical concern as indicated by the patient in the axilla.    Finding 2:  A focused ultrasound was performed of the left breast at 6 o'clock l ocated 3 centimeters from the nipple.  No suspicious sonographic abnormality is present in the area of clinical concern as indicated by the patient in the left breast at 6 o'clock located 3 centimeters from the nipple.    Finding 3:  A focused ultrasound was performed of the left breast at 8 o'clock located 1 centimeter from the nipple.  There is a simple cyst measuring 5 millimeters in the left breast at 8 o'clock located 1 centimeter  from the nipple. This corresponds to the mammographic mass in the medial left breast.    IMPRESSION:   Finding 1:  There is no specific mammographic or sonographic evidence of  malignancy.    Finding 2:  There is no specific mammographic or sonographic evidence of malignancy.    Finding 3:  Simple cyst in the left breast at 8 o'clock located 1 centimeter from the nipple is benign.    These findings were explained to the patient. She was  encouraged to  follow-up with her physician. In the absence of imaging findings any decision for further intervention, at this time, should be based on the clinical assessment.    BI-RADS Category 2: Benign Finding

## 2020-04-30 NOTE — Procedures (Deleted)
Sys, Conversion Provider Not In - 11/23/2013  Formatting of this note might be different from the original.  Exam Number:  [id number]                        Report Status:  Final  Type:  MRIBrainWO  Date/Time:  05/26/2012 11:10  Exam Code:  [id number]  Ordering Provider:  Gonzella Lex.  Associated Reports:     [id number]:  [id number]  ------------------------------------------------------------------------------    HISTORY:         New foca l neurological deficit - URGE 68 - 1yo pregnant female p/w LUE weakness/paresthesias, b/l LE paresthesia - Assess for Ischemic stroke,Hemorrhage,Vasc malformation/aneurysm      REPORT     Reviewed by Dr. Charlotta Newton with Dr. Festus Holts. Westley Hummer (Attending        Radiologist)         TECHNIQUE:        MRI of the brain without intravenous contrast       MRV of the brain without intravenous contrast       MRA of the brain without intraveno [place] contrast       3-D postprocessing imaging was obtained.         COMPARISON: None         FINDINGS:          MRI brain:         The brain parenchyma demonstrates no significant abnormality.         There is no evidence of intracranial mass, hemorrhage or acute        infarction.         Ventricular size is normal.         The flow voids of the major intracranial vessels appear intact.         The bones and extracranial soft tissues ar e unremarkable.         MRV of the brain:         There is no evidence for occlusion or narrowing of the major dural        venous sinuses. Apparent lack of flow-related enhancement along the        lateral aspect of the transverse sinuses are likely artifactual.         MRA brain:         There is no significant abnormality of the major intracranial        arteries. The anterior cerebral arteries, middle cerebral arteries,        inter nal carotid arteries, and the posterior circulation appear        within normal limits.         IMPRESSION:         1. There is no evidence of intracranial mass, hemorrhage or acute         infarction.         2. No evidence for occlusion, narrowing, or thrombus within the major        dural venous sinuses.         3. No significant abnormality of the major intracranial arteries.       PROVIDERS:                           SIGNATURES:        Charlotta Newton MD  Hunt Oris MD                     Hunt Oris MD

## 2020-04-30 NOTE — Procedures (Deleted)
Sys, Conversion Provider Not In - 11/25/2013  Formatting of this note might be different from the original.  Exam Number:  [id number]                        Report Status:  Final  Type:  OBUS Limited OB Ultrasound  Date/Time:  10/03/2011 09:58  Exam Code:  [id number]  Ordering Provider:  Norval Morton  Associated Reports:       [id number]:  OBUS-BioPhysical Prof Single  ------------------------------------------------------------------ ------------    HISTORY:         BPP ONLY, D.F.M, NRNST -       REPORT     Requester:  Norval Morton         INDICATION:  Non reactive NST, decreased fetal movement.  BPP without        measurements         Examination of the uterus reveals a single active fetus in vertex        presentation with a anterior placenta.  The fetal heart rate and        amniotic fluid volume appear normal.         The biophysical profile was 8/8.          The patient was to see Triage immediately after the scan and this        report will be made available.         sm/th      PROVIDERS:                           SIGNATURES:       Loura Halt MD                    Loura Halt MD

## 2020-04-30 NOTE — Narrator Note (Signed)
Pt states she is hungry and would like food, provided with boxed lunch  Pt states she no longer wanted the food and stated she needs to leave now.   IV pulled per request, MD notified of elopement

## 2020-04-30 NOTE — Narrator Note (Signed)
Pt laying semi fowler on str, requesting need to use bathroom. Ambulatory steady gait to bathroom. Saline continues to admin via IV

## 2020-04-30 NOTE — ED Triage Note (Signed)
Pt c/o L rib pain worsening past 2 days with L posterior shoulder/neck pain with LUE numbness since yesterday. + grasp LUE. Pt c/o L flank pain since yesterday with painful urination. Denies fever, chills, nausea, cough.

## 2020-04-30 NOTE — Procedures (Deleted)
Sys, Conversion Provider Not In - 11/24/2013  Formatting of this note might be different from the original.  Exam Number:  [id number]                        Report Status:  Final  Type:  OBUS Limited OB Ultrasound  Date/Time:  01/28/2012 16:31  Exam Code:  [id number]  Ordering Provider:  Brand Males MD  Associated Reports:     [id number]:  OBUS Transvaginal Ultrasound  ------------------------------------------------------------------ ------------    HISTORY:         viability -       REPORT     Requester:  Brand Males, MD         INDICATION:  Bleeding         Examination of the pelvis reveals a normal anteverted uterus. The        endometrial stripe is thin. There is no evidence of an intrauterine        sac.  The previously seen gestational sac is no longer present (of        note, no yolk sac was ever visible).  The left ovary appears normal.         The  right ovary has a 1.3 cm sonolucent cyst, smaller than on prior        scan. No adnexal masses were seen.  There is no free fluid in the cul        de sac.         The transvaginal approach was used in the evaluation of the uterus        because adequate transabdominal images could not be obtained. The        sonographer was chaperoned during this portion of the exam.          The patient was to see her provider immediately after the sc an for        further management.          js      PROVIDERS:                           SIGNATURES:       Clayborne Artist MD MD              Clayborne Artist MD MD

## 2020-04-30 NOTE — Procedures (Deleted)
Unice Cobble, MD - 11/05/2017  Formatting of this note might be different from the original.      US PELVIS TRANSABDOMINAL PLUS TRANSVAGINAL, [place] 3D RECONSTRUCTION PELVIC TRANSABD  TRANSVAG    TECHNIQUE:  Transabdominal and transvaginal ultrasound imaging of the pelvis was performed.  Transvaginal imaging was performed to provide superior visualization of the  endometrium and/or adnexa.    3D reconstructions were created on an ind ependent workstation and reviewed.    COMPARISON: XR ABDOMEN 1 VIEW 10/15/2017, CT ABDOMEN/PELVIS WITH CONTRAST  09/20/2017.    FINDINGS:    KIDNEYS: Unremarkable.    UTERUS:  The uterus measures 8.3 x 4.5 x 5.7 cm.  The endometrial echocomplex  measures 7 mm. There is an IUD in appropriate position.      OVARIES/ADNEXA: Ovaries are within normal limits.    PELVIS:  No free fluid.      IMPRESSION:   IUD is satisfactorily positioned within  the endometrial cavity. Otherwise,  unremarkable pelvic ultrasound.            ATTESTATION: I, Dr. Unice Cobble as teaching physician, have reviewed the  images for this case and if necessary edited the report originally created by  Dr. Olen Pel.

## 2020-04-30 NOTE — Procedures (Deleted)
Blenda Mounts, MD - 04/06/2017  Formatting of this note might be different from the original.  REPORT:               SITE READ:  1      STUDY: Focused ultrasound of the soft tissues of the right submandibular region/neck    INDICATION: Swelling of the right submandibular region/neck, one month of sore throat. Dysphagia.    COMPARISON: None available    TECHNIQUE: Grey-scale and color doppler of the soft tissues was performed.    FI NDINGS:  No right submandibular hyperemia or mass. There is a node with preserved fatty hilum anterior to the submandibular gland with a short axis of 6 mm but with a thick cortex therefore could be reactive. Subcentimeter short axis nodes are appreciated along the jugular chain, relatively symmetric. Patient also points real-time to the submental region, there is no adenopathy or collection at this location.       IMPRESSION:   No susp icious adenopathy or fluid collection or mass in the right neck.  There is a node with preserved fatty hilum anterior to the submandibular gland with a short axis of 6 mm but with a thick cortex therefore probably reactive. Subcentimeter short axis nodes are appreciated along the jugular chain, relatively symmetric; favored reactive.  Depending on the clinical scenario could consider a follow-up to resolution.
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
## 2020-04-30 NOTE — Procedures (Deleted)
Lorinda Creed, MD - 06/17/2015  Formatting of this note might be different from the original.  TECHNIQUE:   CT of the abdomen and pelvis WITHOUT intravenous contrast.     COMPARISON: CT abdomen and pelvis dated March 31, 2007.    FINDINGS:    ABSENCE OF INTRAVENOUS CONTRAST DECREASES SENSITIVITY FOR DETECTION OF FOCAL  LESIONS AND VASCULAR PATHOLOGY.    LOWER THORAX: Lung bases clear. No pleural or pericardial effusion.     HEPATOBILIARY: Hepatic steatosis limits sensitivity for detection of focal  hepatic lesion. No biliary ductal dilatation. Surgically absent gallbladder.  SPLEEN: No splenomegaly.  PANCREAS: No focal masses or ductal dilatation.    ADRENALS: No adrenal nodules.  KIDNEYS/URETERS: No hydroureteronephrosis, stones, or solid mass lesions.  PELVIC ORGANS/BLADDER: IUD within the uterus.    PERITONEUM / RETROPERITONEUM: No free air or fluid.  L YMPH NODES: No lymphadenopathy.  VESSELS: Unremarkable.    GI TRACT: No distention or wall thickening. Normal appendix.    BONES AND SOFT TISSUES: Unremarkable.    IMPRESSION:   No acute intra-abdominal abnormality. Specifically, no stone or  hydroureteronephrosis.

## 2020-04-30 NOTE — Procedures (Deleted)
Alexis Keeler, MD - 04/30/2019  Formatting of this note might be different from the original.  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    COMPARISON:  Comparison made to prior MRI and mammography exams.    TECHNIQUE:  The patient was placed prone in a bilateral dedicated 16 channel open breast coil ([place] , [employer]) and the following series were obtained with a 3.0 Tesla 750 MR scanner (GE):  3-plane localizer; axial non fat suppressed T1; axial fat suppressed 3D T2 (CUBE), one pre contrast and two post-contrast axial dynamic fat suppressed 3D T1 (VIBRANT) (initial post contrast centered at 75 seconds and delayed post contrast centered at 195 seconds after contrast injection initiated).  Contrast was delivered as a power-injected (2 cc/se c) weight-adjusted (0.1 mm/kg) dose of gadolinium followed by a 15 cc saline flush.    Multi-planar reconstructions in sagittal and coronal orientations were created of the immediate post-contrast T1-weighted images.  Subtraction images were created by subtracting the pre-contrast from immediate post-contrast T1-weighted images, and were presented both in the axial plane and in 3D reconstructions (maximum intensity projections). Enhance ment kinetics (initial and delayed) were evaluated for the dynamic phase series and displayed with color overlay maps. Initial enhancement thresholds of 50% increase for medium uptake and 100% increase for rapid uptake were applied, and delayed curve types were defined as washout for 10% or greater decrease in signal intensity, plateau for less than 10% change in signal intensity, and persistent enhancement for 10% or greater increase i n signal intensity.    BREAST MRI FINDINGS:  There is heterogenous fibroglandular tissue.    There is marked background parenchymal enhancement.    There is an area of prior excisional  biopsy in the left breast.    There is no suspicious mass or area of abnormal enhancement in either breast.    There is no abnormality of the chest wall, axillae, or nipple areolar complexes.    IMPRESSION:   There is no MR evidence of malignancy in eithe r breast.    BI-RADS Category 2: Benign Finding

## 2020-04-30 NOTE — Procedures (Deleted)
Sys, Conversion Provider Not In - 09/10/2013  Formatting of this note might be different from the original.  Exam Number:  [id number]                        Report Status:  Final  Type:  OBUS-BioPhysical Prof Single  Date/Time:  11/05/2012 11:03  Exam Code:  [id number]  Ordering Provider:  Norval Morton  Associated Reports:       [id number]:  OBUS Limited OB Ultrasound  ------------------------------------------------------------------ ------------    HISTORY:         BPP ONLY HXIUFD @35 .6 -       REPORT     Requester:  Norval Morton         INDICATION:  Maternal risk factor: HX of IUFD, increased risk for        stillbirth.  BPP without measurements         Examination of the uterus reveals a single active fetus in vertex        presentation with a posterior placenta.  The fetal heart rate and        amniotic fluid volume appear normal.          The biophysic al profile was 8/8.         MMR      PROVIDERS:                           SIGNATURES:       Loura Halt MD                    Loura Halt MD

## 2020-04-30 NOTE — Procedures (Deleted)
System, Provider Not In, PhD - 02/02/2014  Formatting of this note might be different from the original.  Exam Number:  [id number]                        Report Status:  Final  Type:  Hand Minimum 3 Views  Date/Time:  02/02/2014 15:43  Exam Code:  XRHND2/RIGHT  Ordering Provider:  Grant Ruts MD  Associated Reports:     [id number]:  Wrist Complete Min 3 Views  -------------------------------------------------------------------------- ----    HISTORY:         Pain - Stiffness - Swelling - Pain/swelling and some warmth dorsal radial side of hand  struck hand in a fall at work on 01/20/2014.      REPORT     Right wrist Complete Min 3 Views 02/02/2014, right hand Minimum 3        Views 02/02/2014.         COMPARISON: None         FINDINGS:       No displaced fracture or malalignment. There is soft tissue swelling.         IMPRESSION:        No displaced fracture             PROVIDERS:                           SIGNATURES:       Guadlupe Spanish MD          Guadlupe Spanish MD

## 2020-04-30 NOTE — Procedures (Deleted)
Sandie Ano, MD - 01/20/2015  Formatting of this note might be different from the original.  XR KNEE 4 OR MORE VIEWS (LEFT)    COMPARISON: None    FINDINGS:  No bone, joint, or soft tissue abnormality is seen.    IMPRESSION:   IMPRESSION:   Normal examination

## 2020-04-30 NOTE — Procedures (Deleted)
Dontchos, Sheralyn Boatman, MD - 01/15/2017  Formatting of this note might be different from the original.  HISTORY:  39 year old female seen for diagnostic evaluation of lumps in the left breast.    PROCEDURE:  Diagnostic mammographic and tomosynthesis views were obtained. Computer-aided detection was utilized by the radiologist in the interpretation of this examination.    BILATERAL MAMMOGRAM:  The present study is compared to previous imaging .  The breast tissue is extremely dense. This may lower the sensitivity of mammography.    Finding 1:  There is no mammographic abnormality in  the area of clinical concern as indicated by the patient in the axilla.    Finding 2:  There is no mammographic abnormality in  the area of clinical concern as indicated by the patient in the left breast at 6 o'clock located 3 centimeters from the nipple.    Incidental 6 mm circumscribed mass is  seen in the medial left breast, seen only on CC view.    No suspicious masses, calcifications or other abnormalities are seen in the right breast.    BREAST ULTRASOUND  Finding 1:  A focused ultrasound was performed of the axilla.  No suspicious sonographic abnormality is present in the area of clinical concern as indicated by the patient in the axilla.    Finding 2:  A focused ultrasound was performed of the left breast at 6 o'clock l ocated 3 centimeters from the nipple.  No suspicious sonographic abnormality is present in the area of clinical concern as indicated by the patient in the left breast at 6 o'clock located 3 centimeters from the nipple.    Finding 3:  A focused ultrasound was performed of the left breast at 8 o'clock located 1 centimeter from the nipple.  There is a simple cyst measuring 5 millimeters in the left breast at 8 o'clock located 1 centimeter  from the nipple. This corresponds to the mammographic mass in the medial left breast.    IMPRESSION:   Finding 1:  There is no specific mammographic or sonographic evidence of  malignancy.    Finding 2:  There is no specific mammographic or sonographic evidence of malignancy.    Finding 3:  Simple cyst in the left breast at 8 o'clock located 1 centimeter from the nipple is benign.    These findings were explained to the patient. She was  encouraged to  follow-up with her physician. In the absence of imaging findings any decision for further intervention, at this time, should be based on the clinical assessment.    BI-RADS Category 2: Benign Finding

## 2020-04-30 NOTE — Procedures (Deleted)
System, Provider Not In, PhD - 08/29/2014  Formatting of this note might be different from the original.  Exam Number:  [id number]                        Report Status:  Final  Type:  MRICervSpnBneWO  Date/Time:  08/27/2014 17:24  Exam Code:  [id number]  Ordering Provider:  Ron Agee MD    HISTORY:         Abnormal extremity sensation - Radiculopathy - 39 yo F with hx of R hand weakness, concern by specialist for c6 radiculopath y,      REPORT     TECHNIQUE:       Magnetic resonance imaging of the cervical spine was performed        WITHOUT intravenous contrast using standard department protocols.          COMPARISON: MRI cervical spine 05/26/2012.         FINDINGS:       The study is limited by patient motion. The alignment is normal. Bone        marrow is normal in signal without evidence of fracture or marrow        replacing lesion. The spinal cord and visu alized posterior fossa are        normal in signal. Multilevel degenerative changes mildly progressed        compared to 2013 are described below:         C2-3: Unremarkable.       C3-4: Unremarkable.       C4-5: Central posterior protrusion, which effaces the ventral CSF        space and contacts the cord. No neural foraminal stenosis.       C5-6: Posterior disc bulge, which effaces the ventral CSF space, and        mild uncovertebral  hypertrophy. No neural foraminal stenosis.       C6-7: Unremarkable.       C7-T1:Unremarkable.         IMPRESSION:        Multilevel degenerative changes mildly progressed compared to 2013        and most significant at C4-5.      PROVIDERS:                           SIGNATURES:       Delene Loll MD                        Felix Ahmadi M.D.                   Richrd Humbles.D.

## 2020-04-30 NOTE — Procedures (Deleted)
Alexis Parr, MD - 11/03/2014  Formatting of this note might be different from the original.  [place] The present study is compared to previous studies. BREAST ULTRASOUNDA focused ultrasound was performed of the left breast at 3 o'clock.  No macrocyst or solid mass is identified in the area of clinical concern as indicated by the patient in the left breast at 3 o'clock.   IMPRESSION:   IMPRESSION:No sonographic abnorm ality is identified in the area of clinical concern. These findings were explained to the patient through a medical interpreter. She was encouraged to follow-up with her physician. In the absence of imaging findings any decision for further intervention, at this time, should be based on the clinical assessment. Follow up as recommended by Primary Care Physician. BI-RADS Category 1: Negative

## 2020-04-30 NOTE — Procedures (Deleted)
Sys, Conversion Provider Not In - 11/23/2013  Formatting of this note might be different from the original.  Exam Number:  [id number]                        Report Status:  Final  Type:  MRAHeadWOW3D  Date/Time:  05/26/2012 11:10  Exam Code:  [id number]  Ordering Provider:  Gonzella Lex  Associated Reports:       [id number]:  Alexis Guzman  ------------------------------------------------------------------------------    HISTORY:         New fo cal neurological deficit - URGE 79 - 39yo pregnant female p/w LUE weakness/paresthesias, b/l LE paresthesia - Assess for Ischemic stroke,Hemorrhage,Vasc malformation/aneurysm      REPORT     Reviewed by Dr. Charlotta Newton with Dr. Festus Holts. Alexis Guzman (Attending        Radiologist)         TECHNIQUE:        MRI of the brain without intravenous contrast       MRV of the brain without intravenous contrast       MRA of the brain without intrave nous contrast       3-D postprocessing imaging was obtained.         COMPARISON: None         FINDINGS:          MRI brain:         The brain parenchyma demonstrates no significant abnormality.         There is no evidence of intracranial mass, hemorrhage or acute        infarction.         Ventricular size is normal.         The flow voids of the major intracranial vessels appear intact.         The bones and extracranial soft tissues  are unremarkable.         MRV of the brain:         There is no evidence for occlusion or narrowing of the major dural        venous sinuses. Apparent lack of flow-related enhancement along the        lateral aspect of the transverse sinuses are likely artifactual.         MRA brain:         There is no significant abnormality of the major intracranial        arteries. The anterior cerebral arteries, middle cerebral arteries,        int ernal carotid arteries, and the posterior circulation appear        within normal limits.         IMPRESSION:         1. There is no evidence of intracranial mass, hemorrhage or acute         infarction.         2. No evidence for occlusion, narrowing, or thrombus within the major        dural venous sinuses.         3. No significant abnormality of the major intracranial arteries.       PROVIDERS:                           SIGNATURES:        Charlotta Newton MD  Hunt Oris MD                     Hunt Oris MD

## 2020-04-30 NOTE — Procedures (Deleted)
Sys, Conversion Provider Not In - 09/10/2013  Formatting of this note might be different from the original.  Exam Number:  [id number]                        Report Status:  Final  Type:  OBUS Limited OB Ultrasound  Date/Time:  10/29/2012 13:36  Exam Code:  [id number]  Ordering Provider:  Norval Morton  Associated Reports:     [id number]:  OBUS-BioPhysical Prof Single  -------------------------------------------------------------------- ----------    HISTORY:         BPP ONLY HX IUFD @ 35.6 -       REPORT     Requester:  Norval Morton         INDICATION:History of IUFD, increased risk for stillbirth.  BPP        without measurements         Examination of the uterus reveals a single active fetus in vertex        presentation with a posterior placenta.  The fetal heart rate and        amniotic fluid volume appear normal.          The biophysical profile was 8/8.          ak      PROVIDERS:                           SIGNATURES:       Loura Halt MD                    Loura Halt MD

## 2020-05-03 LAB — EKG

## 2020-07-01 ENCOUNTER — Emergency Department (HOSPITAL_BASED_OUTPATIENT_CLINIC_OR_DEPARTMENT_OTHER): Payer: No Typology Code available for payment source

## 2020-07-01 ENCOUNTER — Encounter (HOSPITAL_BASED_OUTPATIENT_CLINIC_OR_DEPARTMENT_OTHER): Payer: Self-pay

## 2020-07-01 ENCOUNTER — Emergency Department
Admission: EM | Admit: 2020-07-01 | Discharge: 2020-07-01 | Disposition: A | Discharge status: Home / Self Care | Payer: No Typology Code available for payment source | Attending: Emergency Medicine | Admitting: Emergency Medicine

## 2020-07-01 ENCOUNTER — Other Ambulatory Visit: Payer: Self-pay

## 2020-07-01 DIAGNOSIS — R079 Chest pain, unspecified: Secondary | ICD-10-CM

## 2020-07-01 DIAGNOSIS — R0789 Other chest pain: Secondary | ICD-10-CM | POA: Insufficient documentation

## 2020-07-01 DIAGNOSIS — R9431 Abnormal electrocardiogram [ECG] [EKG]: Secondary | ICD-10-CM

## 2020-07-01 LAB — CBC, PLATELET & DIFFERENTIAL
ABSOLUTE BASO COUNT: 0.1 10*3/uL (ref 0.0–0.1)
ABSOLUTE EOSINOPHIL COUNT: 0.2 10*3/uL (ref 0.0–0.8)
ABSOLUTE IMM GRAN COUNT: 0.02 10*3/uL (ref 0.00–0.03)
ABSOLUTE LYMPH COUNT: 2.7 10*3/uL (ref 0.6–5.9)
ABSOLUTE MONO COUNT: 0.6 10*3/uL (ref 0.2–1.4)
ABSOLUTE NEUTROPHIL COUNT: 3.8 10*3/uL (ref 1.6–8.3)
ABSOLUTE NRBC COUNT: 0 10*3/uL (ref 0.0–0.0)
BASOPHIL %: 1 % (ref 0.0–1.2)
EOSINOPHIL %: 2 % (ref 0.0–7.0)
HEMATOCRIT: 41.3 % (ref 34.1–44.9)
HEMOGLOBIN: 13.8 g/dL (ref 11.2–15.7)
IMMATURE GRANULOCYTE %: 0.3 % (ref 0.0–0.4)
LYMPHOCYTE %: 36.7 % (ref 15.0–54.0)
MEAN CORP HGB CONC: 33.4 g/dL (ref 31.0–37.0)
MEAN CORPUSCULAR HGB: 31.1 pg (ref 26.0–34.0)
MEAN CORPUSCULAR VOL: 93 fl (ref 80.0–100.0)
MEAN PLATELET VOLUME: 10.7 fL (ref 8.7–12.5)
MONOCYTE %: 8.3 % (ref 4.0–13.0)
NEUTROPHIL %: 51.7 % (ref 40.0–75.0)
NRBC %: 0 % (ref 0.0–0.0)
PLATELET COUNT: 299 10*3/uL (ref 150–400)
RBC DISTRIBUTION WIDTH STD DEV: 43.1 fL (ref 35.1–46.3)
RED BLOOD CELL COUNT: 4.44 M/uL (ref 3.90–5.20)
WHITE BLOOD CELL COUNT: 7.4 10*3/uL (ref 4.0–11.0)

## 2020-07-01 LAB — TROPONIN I: TROPONIN I: <0.02 ng/mL (ref 0.00–0.04)

## 2020-07-01 LAB — SERUM DRUG SCREEN
ACETAMINOPHEN: <2 ug/mL (ref 10–30)
ETHANOL: <3 mg/dL (ref 0–3)
SALICYLATE: <2.8 mg/dL (ref 2.8–20.0)

## 2020-07-01 LAB — BASIC METABOLIC PANEL
ANION GAP: 12 mmol/L (ref 5–15)
BUN (UREA NITROGEN): 12 mg/dL (ref 7–18)
CALCIUM: 8.1 mg/dL — ABNORMAL LOW (ref 8.5–10.1)
CARBON DIOXIDE: 27 mmol/L (ref 21–32)
CHLORIDE: 105 mmol/L (ref 98–107)
CREATININE: 0.8 mg/dL (ref 0.4–1.2)
ESTIMATED GLOMERULAR FILT RATE: >60 mL/min (ref >60)
Glucose Random: 158 mg/dL (ref 74–160)
POTASSIUM: 3.7 mmol/L (ref 3.5–5.1)
SODIUM: 143 mmol/L (ref 136–145)

## 2020-07-01 LAB — MAGNESIUM: MAGNESIUM: 2.3 mg/dL (ref 1.8–2.4)

## 2020-07-01 MED ORDER — ACETAMINOPHEN 500 MG PO TABS
1000.00 mg | ORAL_TABLET | Freq: Once | ORAL | Status: AC
Start: 2020-07-01 — End: 2020-07-01
  Administered 2020-07-01: 1000 mg via ORAL
  Filled 2020-07-01: qty 2

## 2020-07-01 MED ORDER — IBUPROFEN 600 MG PO TABS
600.00 mg | ORAL_TABLET | Freq: Three times a day (TID) | ORAL | 0 refills | Status: AC | PRN
Start: 2020-07-01 — End: 2020-07-11

## 2020-07-01 MED ORDER — KETOROLAC TROMETHAMINE 30 MG/ML INJ
15.00 mg | Freq: Once | Status: AC
Start: 2020-07-01 — End: 2020-07-01
  Administered 2020-07-01: 15 mg via INTRAVENOUS
  Filled 2020-07-01: qty 1

## 2020-07-01 MED ORDER — FAMOTIDINE-CA CARB-MAG HYDROX 10-800-165 MG PO CHEW
1.00 | CHEWABLE_TABLET | Freq: Two times a day (BID) | ORAL | 0 refills | Status: AC | PRN
Start: 2020-07-01 — End: 2020-07-16

## 2020-07-01 MED ORDER — FAMOTIDINE 20 MG PO TABS
20.00 mg | ORAL_TABLET | Freq: Once | ORAL | Status: AC
Start: 2020-07-01 — End: 2020-07-01
  Administered 2020-07-01: 20 mg via ORAL
  Filled 2020-07-01: qty 1

## 2020-07-01 MED ORDER — ACETAMINOPHEN 500 MG PO TABS
500.00 mg | ORAL_TABLET | Freq: Four times a day (QID) | ORAL | 0 refills | Status: AC | PRN
Start: 2020-07-01 — End: 2020-07-11

## 2020-07-01 NOTE — Narrator Note (Signed)
Patient Disposition  Patient education for diagnosis, medications, activity, diet and follow-up.  Patient left ED 5:22 AM.  Patient rep received written instructions.    Interpreter to provide instructions: Yes; Interpreter ID: [id number]    Patient belongings with patient: YES    Have all existing LDAs been addressed? Yes    Have all IV infusions been stopped? N/A    Destination: Discharged to home

## 2020-07-01 NOTE — ED Triage Note (Signed)
Patient comes in for upper back pain that has been for 2 weeks however today it feels worse. C/o headache, shortness of breath and chest pain with deep breath. Patient took tylenol yesterday without relief. Speaking in full sentences. Hx of DM.

## 2020-07-01 NOTE — ED Provider Notes (Signed)
[facility] Emergency Medicine Attending Note      History of Present Illness:    Alexis Guzman is a 39 year old female who came to the ED for evaluation of left-sided back/chest discomfort about a week, getting worse yesterday.  Patient says her symptoms seem to be worse when she lies down or with movements.  She was taking Tylenol, took some yesterday, maybe helped a little bit.  No fever chills, no cough.  No events of like this before.  She does not smoke or use drugs.  No known heart problems.   Valoria Tamburri   MRN: [id number]  PCP: Binnie Rail, MD    Arrived to the ED by: Self    History provided by: the patient using a Spanish interpreter  Other: Nursing notes, medical records here/elsewhere as available through Epic    Review of Systems:  All other systems are reviewed and are negative except as noted.     Expect Note (if available):         Past Medical Hx:  Past Medical History:  No date: Diabetes mellitus ([place]) Meds:  Current Facility-Administered Medications   Medication    ketorolac (TORADOL) injection 15 mg    acetaminophen (TYLENOL) tablet 1,000 mg    famotidine (PEPCID) tablet 20 mg     No current outpatient medications on file.         Past Surgical Hx:  History reviewed. No pertinent surgical history. 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    Head, ears, nose, and throat: Normocephalic and atraumatic.      Respiratory/chest: No respiratory distress, speaks in full sentences. Breath sounds are clear and equal bilaterally.  Left anterior chest wall is tender to palpation    Cardiovascular: Heart rate is regular in rate and rhythm. Pulses are 2+ and symmetric.  No peripheral edema    Musculoskeletal: Normal muscle tone, moving all extremities, normal gait.  Back appears normal, no CVA tenderness, no rash    Skin: Warm and well perfused, no rashes or erythema/ecchymosis.    Neurologic: Alert and interactive    Medications Given in the ED:    Medications   ketorolac (TORADOL) injection 15 mg (has no administration in time range)   acetaminophen (TYLENOL) tablet 1,000 mg (has no administration in time range)   famotidine (PEPCID) tablet 20 mg (has no administration in time range)    Radiology and ECG:    XR Chest Portable    (Results Pending)    Electrocardiogram: Normal sinus rhythm at 72 bpm, normal axis, normal QRS and QT intervals. No ST changes, nonischemic.  Lab Results:    Labs Reviewed   BASIC METABOLIC PANEL - Abnormal; Notable for the following components:       Result Value    CALCIUM 8.1 (*)     All other components within normal limits   CBC, PLATELET & DIFFERENTIAL   MAGNESIUM   TROPONIN I   SERUM DRUG SCREEN    Vital Signs:     07/01/20  0307   BP: 112/76   Pulse: 68   Resp: 18   Temp: 98 F   SpO2: 97%   Weight: 93 kg (205 lb)          ED Course and Medical Decision Making:    Here with vague left-sided chest/back discomfort for a week.  Vital signs normal.  Exam fairly benign, seems likely musculoskeletal.  We will get a chest x-ray and labs, EKG and reassess.  Anticipate discharge home.    ED Course as of Jul 01 501   Fri Jul 01, 2020   0458 Resting comfortably.  Patient's labs including troponin all look okay.  Chest x-ray looks clear to me.  ECG unremarkable.  Patient reassured, given timeline of her symptoms no concern for ACS, stable for discharge.  Follow-up with PCP.  Return precautions  given.        Patient/family educated on their diagnosis, she verbalizes understanding and agrees with plan of care. She was told to follow up with her primary care physician. I reviewed with her reasons to return to the Emergency Department, all questions were answered.    ED Disposition:     Impression(s): Nonspecific chest pain    Disposition:  Discharged home    Signed by Dr. Freddie Breech. Glori Bickers, DO, [place]  Emergency Medicine Attending  [facility]

## 2020-07-03 LAB — EKG

## 2020-07-04 LAB — EKG

## 2020-07-24 ENCOUNTER — Emergency Department (HOSPITAL_BASED_OUTPATIENT_CLINIC_OR_DEPARTMENT_OTHER): Payer: No Typology Code available for payment source

## 2020-07-24 ENCOUNTER — Other Ambulatory Visit: Payer: Self-pay

## 2020-07-24 ENCOUNTER — Emergency Department
Admission: EM | Admit: 2020-07-24 | Discharge: 2020-07-25 | Disposition: A | Discharge status: Home / Self Care | Payer: No Typology Code available for payment source | Attending: Emergency Medicine | Admitting: Emergency Medicine

## 2020-07-24 ENCOUNTER — Encounter (HOSPITAL_BASED_OUTPATIENT_CLINIC_OR_DEPARTMENT_OTHER): Payer: Self-pay

## 2020-07-24 DIAGNOSIS — R531 Weakness: Secondary | ICD-10-CM | POA: Diagnosis not present

## 2020-07-24 DIAGNOSIS — R079 Chest pain, unspecified: Secondary | ICD-10-CM

## 2020-07-24 DIAGNOSIS — M549 Dorsalgia, unspecified: Secondary | ICD-10-CM | POA: Diagnosis not present

## 2020-07-24 DIAGNOSIS — R29818 Other symptoms and signs involving the nervous system: Secondary | ICD-10-CM

## 2020-07-24 DIAGNOSIS — R519 Headache, unspecified: Secondary | ICD-10-CM

## 2020-07-24 DIAGNOSIS — R0789 Other chest pain: Secondary | ICD-10-CM

## 2020-07-24 DIAGNOSIS — M79602 Pain in left arm: Secondary | ICD-10-CM | POA: Diagnosis not present

## 2020-07-24 LAB — MAGNESIUM: MAGNESIUM: 2.1 mg/dL (ref 1.8–2.4)

## 2020-07-24 LAB — RESPIRATORY PANEL BASIC INPAT
INFLUENZA A: NEGATIVE
INFLUENZA B: NEGATIVE
RESPIRATORY SYNCYTIAL VIRUS: NEGATIVE
SARS-COV-2: NEGATIVE

## 2020-07-24 LAB — CBC, PLATELET & DIFFERENTIAL
ABSOLUTE BASO COUNT: 0.1 10*3/uL (ref 0.0–0.1)
ABSOLUTE EOSINOPHIL COUNT: 0.2 10*3/uL (ref 0.0–0.8)
ABSOLUTE IMM GRAN COUNT: 0.03 10*3/uL (ref 0.00–0.03)
ABSOLUTE LYMPH COUNT: 2.8 10*3/uL (ref 0.6–5.9)
ABSOLUTE MONO COUNT: 0.6 10*3/uL (ref 0.2–1.4)
ABSOLUTE NEUTROPHIL COUNT: 4.1 10*3/uL (ref 1.6–8.3)
ABSOLUTE NRBC COUNT: 0 10*3/uL (ref 0.0–0.0)
BASOPHIL %: 0.8 % (ref 0.0–1.2)
EOSINOPHIL %: 2.7 % (ref 0.0–7.0)
HEMATOCRIT: 41.9 % (ref 34.1–44.9)
HEMOGLOBIN: 13.8 g/dL (ref 11.2–15.7)
IMMATURE GRANULOCYTE %: 0.4 % (ref 0.0–0.4)
LYMPHOCYTE %: 36 % (ref 15.0–54.0)
MEAN CORP HGB CONC: 32.9 g/dL (ref 31.0–37.0)
MEAN CORPUSCULAR HGB: 31 pg (ref 26.0–34.0)
MEAN CORPUSCULAR VOL: 94.2 fl (ref 80.0–100.0)
MEAN PLATELET VOLUME: 11.4 fL (ref 8.7–12.5)
MONOCYTE %: 7.8 % (ref 4.0–13.0)
NEUTROPHIL %: 52.3 % (ref 40.0–75.0)
NRBC %: 0 % (ref 0.0–0.0)
PLATELET COUNT: 317 10*3/uL (ref 150–400)
RBC DISTRIBUTION WIDTH STD DEV: 43.5 fL (ref 35.1–46.3)
RED BLOOD CELL COUNT: 4.45 M/uL (ref 3.90–5.20)
WHITE BLOOD CELL COUNT: 7.8 10*3/uL (ref 4.0–11.0)

## 2020-07-24 LAB — TROPONIN I: TROPONIN I: <0.02 ng/mL (ref 0.00–0.04)

## 2020-07-24 LAB — COMPREHENSIVE METABOLIC PANEL
ALANINE AMINOTRANSFERASE: 40 U/L (ref 12–45)
ALBUMIN: 3.5 g/dL (ref 3.4–5.0)
ALKALINE PHOSPHATASE: 116 U/L (ref 45–117)
ANION GAP: 11 mmol/L (ref 5–15)
ASPARTATE AMINOTRANSFERASE: 23 U/L (ref 8–34)
BILIRUBIN TOTAL: 0.5 mg/dL (ref 0.2–1.0)
BUN (UREA NITROGEN): 10 mg/dL (ref 7–18)
CALCIUM: 8.1 mg/dL — ABNORMAL LOW (ref 8.5–10.1)
CARBON DIOXIDE: 26 mmol/L (ref 21–32)
CHLORIDE: 101 mmol/L (ref 98–107)
CREATININE: 0.8 mg/dL (ref 0.4–1.2)
ESTIMATED GLOMERULAR FILT RATE: >60 mL/min (ref >60)
Glucose Random: 221 mg/dL — ABNORMAL HIGH (ref 74–160)
POTASSIUM: 3.7 mmol/L (ref 3.5–5.1)
SODIUM: 138 mmol/L (ref 136–145)
TOTAL PROTEIN: 7.1 g/dL (ref 6.4–8.2)

## 2020-07-24 LAB — NT-PROBNP: NT-proBNP: 9 pg/mL (ref 0–125)

## 2020-07-24 LAB — HCG QUALITATIVE SERUM: HCG QUALITATIVE SERUM: NEGATIVE

## 2020-07-24 LAB — HOLD BLUE TOP TUBE

## 2020-07-24 NOTE — ED Notes (Signed)
EKG done at this time and reviewed by MD.

## 2020-07-24 NOTE — ED Triage Note (Signed)
Pt self presents to ED with c/o CP, SOB, HA, nausea since this am. Denies taking anything for s/sx. States she is unvaccinated but unaware of any sick contacts for COVID. Placed on cardiac monitor. EKG obtained. IV Placed. PA at bedside.

## 2020-07-24 NOTE — ED Provider Notes (Signed)
Patient seen and evaluated with the PA/resident. Please see their ED Provider Note for additional details.    Subjective:   Hx DM, with left hand pain this morning, that spread to left shoulder, then left chest. Progressing to numbness, then weakness of the hand and elbow. Also with headache and chest, but these are less severe.    Objective:  Well appearing, in no distress. No weakness in the arms or legs. Normal cranial nerves. Lungs clear to auscultation.    Assessment/Plan:  Patient with constellation of symptoms concerning for dissection or intracranial bleeding, so ordered CT/CTA of head, neck, and chest. Fortunately, these are normal and reassuring. She is now feeling better, and appears safe for discharge.     Olam Idler, MD  Attending Physician  [facility] Department of Emergency Medicine

## 2020-07-24 NOTE — ED Provider Notes (Signed)
eMERGENCY dEPARTMENT Physician Assistant NOTE    The ED nursing record was reviewed.   The prior medical records as available electronically through Epic were reviewed.  The mode of arrival was Self     This patient was seen with Emergency Department attending physician Dr.     Laurel Dimmer COMPLAINT    Patient presents with:  Chest Pain      HPI    Alexis Guzman is a 40 year old female with history of diabetes presenting to the emergency department for evaluation of headache, chest pain and left arm numbness/weakness.    Her symptom started this morning.  She states initially she developed some left arm pain ended later spread up her entire arm.  She also has left-sided headache.  No vision changes.  She has never had symptoms like this previously.  No head trauma, back trauma or recent neck manipulation.    She states her chest pain is only very mild.  No cough or Covid symptoms.  No known sick contacts.  She received 2 COVID-19 vaccines, no booster.      PAST MEDICAL HISTORY    Past Medical History:  No date: Diabetes mellitus ([place])    PROBLEM LIST  Patient Active Problem List:     [id number] virus infection     Abdominal pain, epigastric     Acute viral syndrome     BMI 40.0-44.9, adult (HCC)     Breast pain in female     Dysuria     Other chest pain     Pelvic pain     Cervicalgia     Constipation     Cough     Cyst of right ovary     Dysmenorrhea     Dyspepsia     Elevated LFTs     History of abnormal cervical Pap smear     HSV infection     Internal hemorrhoids     Intractable abdominal pain     Lower abdominal pain     IUD check up     Left foot pain     Lobular carcinoma in situ (LCIS) of left breast     Low back pain     Mantoux: positive     MDD (major depressive disorder), recurrent episode, moderate (HCC)     Nonintractable migraine     Moderate binge-eating disorder, in full remission     NASH (nonalcoholic steatohepatitis)     Non morbid obesity due to excess calories     Other dysphagia     Pain of right  hand     Palpitations     Panic disorder without agoraphobia     Plantar fasciitis of right foot     Prediabetes     Rib pain on left side     Suspected sleep apnea     Urinary tract infection symptoms     Varicose veins     Vitamin D deficiency      SURGICAL HISTORY    History reviewed. No pertinent surgical history.    CURRENT MEDICATIONS    No current facility-administered medications for this encounter.  No current outpatient medications on file.    ALLERGIES    Review of Patient's Allergies indicates:  No Known Allergies    FAMILY HISTORY    History reviewed.  No pertinent family history.      SOCIAL HISTORY    Social History     Socioeconomic History    Marital status:  Married     Spouse name: Not on file    Number of children: Not on file    Years of education: Not on file    Highest education level: Not on file   Occupational History    Not on file   Tobacco Use    Smoking status: Never Smoker    Smokeless tobacco: Never Used   Substance and Sexual Activity    Alcohol use: Yes    Drug use: Never    Sexual activity: Not on file   Other Topics Concern    Not on file   Social History Narrative    Not on file   Social Determinants of Health  Financial Resource Strain:     Difficulty of Paying Living Expenses: Not on file  Food Insecurity:     Worried About [place] in the Last Year: Not on file    Ran Out of Food in the Last Year: Not on file  Transportation Needs:     Lack of Transportation (Medical): Not on file    Lack of Transportation (Non-Medical): Not on file  Physical Activity:     Days of Exercise per Week: Not on file    Minutes of Exercise per Session: Not on file  Stress:     Feeling of Stress : Not on file  Social Connections:     Frequency of Communication with Friends and Family: Not on file    Frequency of Social Gatherings with Friends and Family: Not on file    Attends Religious Services: Not on file    Active Member of Clubs or Organizations: Not  on file    Attends Archivist Meetings: Not on file    Marital Status: Not on file  Intimate Partner Violence:     Fear of Current or Ex-Partner: Not on file    Emotionally Abused: Not on file    Physically Abused: Not on file    Sexually Abused: Not on file    REVIEW OF SYSTEMS    The pertinent positives are reviewed in the HPI above. All other systems were reviewed and are negative.    PHYSICAL EXAM      BP 127/72    Pulse 84    Temp 98.7 F (Oral)    Resp 20    Wt 95.3 kg (210 lb)    LMP 07/21/2020 (Exact Date)    SpO2 99%   GENERAL:  Well-appearing, no distress.  SKIN:  Warm & Dry, no rash, no bruising.    NECK:  Supple with full painless ROM at the neck    LUNGS:  Clear to auscultation bilaterally without rales, rhonchi or wheezing.     HEART:  Regular rate and rhythm.  No murmurs, rubs, or gallops.   ABDOMEN:  Soft, flat, without distension.  Nontender to palpation.   MUSCULOSKELETAL:  No deformities. Well-perfused extremities with  2+ DP/PT/Rad pulses bilaterally. No cyanosis or edema.    On evaluation of the left arm no swelling or skin changes.  Compartments are soft, range of motion intact. + CSMs distal    NEUROLOGIC:  Normal speech.  No facial droop upper and lower extremity motor and sensory grossly intact.  Alert & oriented x 3, CNsII-XII grossly intact. Gait normal.  Negative pronator drift  Decreased grip strength on the left when compared with the right  Subjective decreased sensation on the left compared to the right      RESULTS  Results  for orders placed or performed during the hospital encounter of 07/24/20 (from the past 24 hour(s))   CBC, Platelet & Differential    Collection Time: 07/24/20  9:34 PM   Result Value    WHITE BLOOD CELL COUNT 7.8    RED BLOOD CELL COUNT 4.45    HEMOGLOBIN 13.8    HEMATOCRIT 41.9    MEAN CORPUSCULAR VOL 94.2    MEAN CORPUSCULAR HGB 31.0    MEAN CORP HGB CONC 32.9    RBC DISTRIBUTION WIDTH STD DEV 43.5    PLATELET COUNT 317    MEAN PLATELET  VOLUME 11.4    NEUTROPHIL % 52.3    IMMATURE GRANULOCYTE % 0.4    LYMPHOCYTE % 36.0    MONOCYTE % 7.8    EOSINOPHIL % 2.7    BASOPHIL % 0.8    NRBC % 0.0    ABSOLUTE NEUTROPHIL COUNT 4.1    ABSOLUTE IMM GRAN COUNT 0.03    ABSOLUTE LYMPH COUNT 2.8    ABSOLUTE MONO COUNT 0.6    ABSOLUTE EOSINOPHIL COUNT 0.2    ABSOLUTE BASO COUNT 0.1    ABSOLUTE NRBC COUNT 0.0   Hold Blue Top Tube    Collection Time: 07/24/20  9:34 PM   Result Value    HOLD BLUE TOP TUBE RECEIVED IN HEMATOL        RADIOLOGY  CXR:  NAD    CT HEAD:  No acute intracranial abnormality    CTAs head/neck/chest:    Readings per [facility]  No dissection    EKG:  EKG reviewed by me and with attending physician  EKG reveals a normal sinus rhythm with rate of 84.  QTc 398.  Nonspecific T wave change. Normal axis, normal intervals, no heart strain and not acute ST or T wave changes indicative of ACS.     Reviewed with prior EKG from 07/01/2020 revealing similar T wave inversion in the anterior leads dictating no significant change from prior        MEDICATIONS ADMINISTERED ON THIS VISIT  No orders of the defined types were placed in this encounter.      ED COURSE & MEDICAL DECISION MAKING      I reviewed the patient's past medical history/problem list, past surgical history, medication list, social history and allergies. Pt remained hemodynamically stable during their stay in the emergency department.       ED Decision Making & Course:   This is a 40 year old female who presents to the emergency department for evaluation of left arm pain, headache and chest pain.  This started this morning.  She also has some slight weakness when assessed with grip strength on the left when compared to the right.  Vital signs stable.  She appears to be in no acute distress.  EKG nonischemic.  Lab work and [employer] testing obtained, currently pending.    CT head within normal limits.  Considering persistent symptoms will obtain dissection studies including CT head/neck and chest.  These  were also normal.  Patient reassured.  Symptoms potentially related to musculoskeletal strain/cervical radiculopathy    We will follow-up with outpatient providers and the reasons to return to the emergency department outlined by me.      Condition: Stable  Disposition: Home      Diagnosis:   Left arm pain       [employer], PA-C

## 2020-07-25 MED ORDER — IOHEXOL 350 MG/ML IV SOLN
50.00 mL | Freq: Once | INTRAVENOUS | Status: AC
Start: 2020-07-25 — End: 2020-07-25
  Administered 2020-07-25: 85 mL via INTRAVENOUS

## 2020-07-25 MED ORDER — NORMAL SALINE FLUSH 0.9 % IV SOLN
10.00 mL | Freq: Once | INTRAVENOUS | Status: AC
Start: 2020-07-25 — End: 2020-07-25
  Administered 2020-07-25: 60 mL via INTRAVENOUS

## 2020-07-25 MED ORDER — IOHEXOL 350 MG/ML IV SOLN
50.00 mL | Freq: Once | INTRAVENOUS | Status: AC
Start: 2020-07-25 — End: 2020-07-25
  Administered 2020-07-25: 80 mL via INTRAVENOUS

## 2020-07-25 MED ORDER — NORMAL SALINE FLUSH 0.9 % IV SOLN
10.00 mL | Freq: Once | INTRAVENOUS | Status: AC
Start: 2020-07-25 — End: 2020-07-25
  Administered 2020-07-25: 80 mL via INTRAVENOUS

## 2020-07-25 NOTE — Discharge Instructions (Addendum)
You were seen in the emergency department for evaluation of your headache, arm pain and chest pain.    Your lab work and imaging including a detailed CT of your head, neck and chest were normal    Followup with your primary care provider within 5 days to evaluate for the resolution of her symptoms.  Return to the emergency department if symptoms worsen, you develop new symptoms or if you cannot schedule appropriate followup care.

## 2020-07-25 NOTE — Narrator Note (Signed)
Patient Disposition  Patient education for diagnosis, medications, activity, diet and follow-up.  Patient left ED 2:18 AM.  Patient rep received written instructions.    Interpreter to provide instructions: No    Patient belongings with patient: YES    Have all existing LDAs been addressed? Yes    Have all IV infusions been stopped? N/A    Destination: Discharged to home

## 2020-07-27 LAB — EKG

## 2020-07-29 LAB — HEMOGLOBIN A1C CARE EVERYWHERE
HEMOGLOBIN A1C CARE EVERYWHERE: 6.9 % — ABNORMAL HIGH (ref 4.3–5.6)
MEAN BLOOD GLUCOSE CARE EVERYWHERE: 151 mg/dL — NL

## 2020-07-29 LAB — HEPATITIS B SURFACE ANTIBODY CARE EVERYWHERE
HEPATITIS B SURFACE ANTIBODY CARE EVERYWHERE: 0 m[IU]/mL — NL
HEPATITIS B SURFACE ANTIBODY CARE EVERYWHERE: NEGATIVE — NL

## 2020-10-14 ENCOUNTER — Emergency Department (HOSPITAL_BASED_OUTPATIENT_CLINIC_OR_DEPARTMENT_OTHER): Payer: No Typology Code available for payment source

## 2020-10-14 ENCOUNTER — Emergency Department
Admission: EM | Admit: 2020-10-14 | Discharge: 2020-10-14 | Disposition: A | Discharge status: Home / Self Care | Payer: No Typology Code available for payment source | Attending: Emergency Medicine | Admitting: Emergency Medicine

## 2020-10-14 ENCOUNTER — Other Ambulatory Visit: Payer: Self-pay

## 2020-10-14 DIAGNOSIS — K625 Hemorrhage of anus and rectum: Secondary | ICD-10-CM | POA: Diagnosis not present

## 2020-10-14 DIAGNOSIS — R1032 Left lower quadrant pain: Secondary | ICD-10-CM

## 2020-10-14 DIAGNOSIS — R112 Nausea with vomiting, unspecified: Secondary | ICD-10-CM

## 2020-10-14 DIAGNOSIS — E119 Type 2 diabetes mellitus without complications: Secondary | ICD-10-CM

## 2020-10-14 DIAGNOSIS — R1010 Upper abdominal pain, unspecified: Secondary | ICD-10-CM | POA: Diagnosis not present

## 2020-10-14 DIAGNOSIS — R109 Unspecified abdominal pain: Secondary | ICD-10-CM | POA: Diagnosis present

## 2020-10-14 LAB — CBC, PLATELET & DIFFERENTIAL
ABSOLUTE BASO COUNT: 0 10*3/uL (ref 0.0–0.1)
ABSOLUTE EOSINOPHIL COUNT: 0.1 10*3/uL (ref 0.0–0.8)
ABSOLUTE IMM GRAN COUNT: 0.03 10*3/uL (ref 0.00–0.03)
ABSOLUTE LYMPH COUNT: 2 10*3/uL (ref 0.6–5.9)
ABSOLUTE MONO COUNT: 0.4 10*3/uL (ref 0.2–1.4)
ABSOLUTE NEUTROPHIL COUNT: 4.2 10*3/uL (ref 1.6–8.3)
ABSOLUTE NRBC COUNT: 0 10*3/uL (ref 0.0–0.0)
BASOPHIL %: 0.4 % (ref 0.0–1.2)
EOSINOPHIL %: 2.1 % (ref 0.0–7.0)
HEMATOCRIT: 40.8 % (ref 34.1–44.9)
HEMOGLOBIN: 13.8 g/dL (ref 11.2–15.7)
IMMATURE GRANULOCYTE %: 0.4 % (ref 0.0–0.4)
LYMPHOCYTE %: 28.9 % (ref 15.0–54.0)
MEAN CORP HGB CONC: 33.8 g/dL (ref 31.0–37.0)
MEAN CORPUSCULAR HGB: 31.2 pg (ref 26.0–34.0)
MEAN CORPUSCULAR VOL: 92.3 fl (ref 80.0–100.0)
MEAN PLATELET VOLUME: 10.8 fL (ref 8.7–12.5)
MONOCYTE %: 5.9 % (ref 4.0–13.0)
NEUTROPHIL %: 62.3 % (ref 40.0–75.0)
NRBC %: 0 % (ref 0.0–0.0)
PLATELET COUNT: 291 10*3/uL (ref 150–400)
RBC DISTRIBUTION WIDTH STD DEV: 43.8 fL (ref 35.1–46.3)
RED BLOOD CELL COUNT: 4.42 M/uL (ref 3.90–5.20)
WHITE BLOOD CELL COUNT: 6.7 10*3/uL (ref 4.0–11.0)

## 2020-10-14 LAB — BASIC METABOLIC PANEL
ANION GAP: 10 mmol/L (ref 5–15)
BUN (UREA NITROGEN): 7 mg/dL (ref 7–18)
CALCIUM: 8.1 mg/dL — ABNORMAL LOW (ref 8.5–10.1)
CARBON DIOXIDE: 25 mmol/L (ref 21–32)
CHLORIDE: 103 mmol/L (ref 98–107)
CREATININE: 0.7 mg/dL (ref 0.4–1.2)
ESTIMATED GLOMERULAR FILT RATE: >60 mL/min (ref >60)
Glucose Random: 146 mg/dL (ref 74–160)
POTASSIUM: 3.8 mmol/L (ref 3.5–5.1)
SODIUM: 138 mmol/L (ref 136–145)

## 2020-10-14 LAB — POC URINALYSIS
BILIRUBIN, URINE: NEGATIVE
GLUCOSE,URINE: NEGATIVE
KETONE, URINE: NEGATIVE
LEUKOCYTE ESTERASE: NEGATIVE
NITRITE, URINE: NEGATIVE
OCCULT BLOOD, URINE: NEGATIVE
PH URINE: 7 (ref 5.0–8.0)
PROTEIN, URINE: NEGATIVE
SPECIFIC GRAVITY, URINE: 1.015 (ref 1.003–1.030)
UROBILINOGEN URINE: 0.2 (ref 0.2–1.0)

## 2020-10-14 LAB — URINE PREGNANCY TEST (POINT OF CARE): HCG QUALITATIVE URINE: NEGATIVE

## 2020-10-14 LAB — HCG QUALITATIVE SERUM: HCG QUALITATIVE SERUM: NEGATIVE

## 2020-10-14 LAB — LIPASE: LIPASE: 103 U/L (ref 73–393)

## 2020-10-14 LAB — HEPATIC FUNCTION PANEL
ALANINE AMINOTRANSFERASE: 33 U/L (ref 12–45)
ALBUMIN: 3.6 g/dL (ref 3.4–5.0)
ALKALINE PHOSPHATASE: 75 U/L (ref 45–117)
ASPARTATE AMINOTRANSFERASE: 23 U/L (ref 8–34)
BILIRUBIN DIRECT: 0.2 mg/dl (ref 0.0–0.2)
BILIRUBIN TOTAL: 0.9 mg/dL (ref 0.2–1.0)
INDIRECT BILIRUBIN: 0.7 mg/dL (ref 0.2–0.9)
TOTAL PROTEIN: 7.2 g/dL (ref 6.4–8.2)

## 2020-10-14 MED ORDER — SODIUM CHLORIDE 0.9 % IV BOLUS
1000.0000 mL | Freq: Once | INTRAVENOUS | Status: AC
Start: 2020-10-14 — End: 2020-10-14
  Administered 2020-10-14: 1000 mL via INTRAVENOUS

## 2020-10-14 MED ORDER — FAMOTIDINE 20 MG/2ML IV SOLN
20.0000 mg | Freq: Once | INTRAVENOUS | Status: AC
Start: 2020-10-14 — End: 2020-10-14
  Administered 2020-10-14: 20 mg via INTRAVENOUS
  Filled 2020-10-14: qty 2

## 2020-10-14 MED ORDER — FAMOTIDINE 20 MG PO TABS
20.00 mg | ORAL_TABLET | Freq: Two times a day (BID) | ORAL | 0 refills | Status: AC
Start: 2020-10-14 — End: 2020-10-24

## 2020-10-14 MED ORDER — NORMAL SALINE FLUSH 0.9 % IV SOLN
50.00 mL | Freq: Once | INTRAVENOUS | Status: AC
Start: 2020-10-14 — End: 2020-10-14
  Administered 2020-10-14: 50 mL via INTRAVENOUS

## 2020-10-14 MED ORDER — IOHEXOL 350 MG/ML IV SOLN
85.00 mL | Freq: Once | INTRAVENOUS | Status: AC
Start: 2020-10-14 — End: 2020-10-14
  Administered 2020-10-14: 85 mL via INTRAVENOUS

## 2020-10-14 MED ORDER — ONDANSETRON HCL 4 MG/2ML IJ SOLN
4.0000 mg | Freq: Once | INTRAMUSCULAR | Status: AC
Start: 2020-10-14 — End: 2020-10-14
  Administered 2020-10-14: 4 mg via INTRAVENOUS
  Filled 2020-10-14: qty 2

## 2020-10-14 NOTE — ED Triage Note (Signed)
Pt presents to ED with c/o ABD pain when eating that radiates to her left flank for the last 6 days and blood in her BM with the last one being two days ago. Reports when she eats she has ABD pain 10/10 and it causes diarrhea. Now she reports feeling constipated. Denies chest pain or SOB.

## 2020-10-14 NOTE — Narrator Note (Signed)
Patient Disposition  Patient education for diagnosis, medications, activity, diet and follow-up.  Patient left ED 1:15 PM.  Patient rep received written instructions.    Interpreter to provide instructions:YES    Patient belongings with patient:YES    Have all existing LDAs been addressed? YES  Have all IV infusions been stopped? YES    Destination: Pt discharged home with summary and scripts.Patient provided verbal and written discharge instructions in primary language of care. Verbalized understanding. Left ED ambulatory.

## 2020-10-14 NOTE — ED Provider Notes (Signed)
The patient was seen primarily by me. ED nursing record was reviewed. Prior records as available electronically through the Epic record were reviewed.    HPI:    This is a 40 year old female patient with past medical history of diabetes, status post cholecystectomy 7 years ago presenting with abdominal pain x6 days.  Patient reports the pain is more upper abdominal and crampy in nature.  Patient reports the pain is worse with eating.  Patient reports the pain does travel to her lower abdomen.  Patient reports for the last bowel was 2 days ago.  Patient reports that she has been trying to go to bathroom but only notes blood.          ROS: Pertinent positives were reviewed as per the HPI above. All other systems were reviewed and are negative.      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     Abdominal pain, epigastric     Acute viral syndrome     BMI 40.0-44.9, adult (HCC)     Breast pain in female     Dysuria     Other chest pain     Pelvic pain     Cervicalgia     Constipation     Cough     Cyst of right ovary     Dysmenorrhea     Dyspepsia     Elevated LFTs     History of abnormal cervical Pap smear     HSV infection     Internal hemorrhoids     Intractable abdominal pain     Lower abdominal pain     IUD check up     Left foot pain     Lobular carcinoma in situ (LCIS) of left breast     Low back pain     Mantoux: positive     MDD (major depressive disorder), recurrent episode, moderate (HCC)     Nonintractable migraine     Moderate binge-eating disorder, in full remission     NASH (nonalcoholic steatohepatitis)     Non morbid obesity due to excess calories     Other dysphagia     Pain of right hand     Palpitations     Panic disorder without agoraphobia     Plantar fasciitis of right foot     Prediabetes     Rib pain on left side     Suspected sleep apnea     Urinary tract infection symptoms     Varicose  veins     Vitamin D deficiency        Past Surgical History: No past surgical history on file.      Medications:   Current Facility-Administered Medications   Medication    sodium chloride 0.9 % IV bolus 1,000 mL    famotidine (PEPCID) injection 20 mg    ondansetron (ZOFRAN) injection 4 mg     No current outpatient medications on file.         Social History: Social History    Tobacco Use      Smoking status: Never Smoker      Smokeless tobacco: Never Used    Alcohol use: Yes        Allergies:  Review of Patient's Allergies indicates:  No Known Allergies      Physical Exam:  BP 125/88  Pulse 80    Temp 98.6 F    Resp 18    LMP 10/09/2020    SpO2 97%     GENERAL: No acute distress.   SKIN:  Warm & Dry, no rash.  HEAD: Atraumatic. PERRL. EOMI.  Oropharynx: clear.  NECK: No midline tenderness.  No LAN.   LUNGS:  Clear to auscultation bilaterally. No wheezes, rales, rhonchi.   HEART:  RRR.  No murmurs, rubs, or gallops.   ABDOMEN:  Soft, NTND.  No guarding or rebound tenderness.   Rectal minimal stool in the vault small amount of brown stool noted external hemorrhoid no active bleeding  MUSCULOSKELETAL:  No obvious deformities.    NEUROLOGIC: Alert and oriented.  Moves all extremities well.  PSYCHIATRIC:  Appropriate for age, time of day, and situation    Labs Reviewed   BASIC METABOLIC PANEL - Abnormal; Notable for the following components:       Result Value    CALCIUM 8.1 (*)     All other components within normal limits    Narrative:     Tests added: HCG QUAL by Manjinder Breau on 10/14/20 at 1059  by [id number].   CBC, PLATELET & DIFFERENTIAL   HEPATIC FUNCTION PANEL    Narrative:     Tests added: HCG QUAL by Kandance Yano on 10/14/20 at 1059  by [id number].   LIPASE    Narrative:     Tests added: HCG QUAL by Phi Avans on 10/14/20 at 1059  by [id number].   LAB ADD ON/WRITE IN TEST   HCG QUALITATIVE SERUM    Narrative:     Tests added: HCG QUAL by Kaydince Towles on 10/14/20 at 1059  by [id number].   POC URINALYSIS   URINE  PREGNANCY TEST (POINT OF CARE)         ED Course and Medical Decision-making:    The patient is a  40 year old female with abdominal pain  EKG sinus rate of 74 no ST elevations or depressions, some T wave flattening diffusely  Labs unremarkable patient on exam has some continued left-sided abdominal pain  CT abdomen ordered for concern for diverticulitis which was unremarkable.  Patient's pain did improve.  Patient will be discharged with Pepcid should follow-up with primary care provider.      Diagnosis abdominal pain  Disposition Home  Condition improved      Serafina Royals, MD

## 2020-10-14 NOTE — Discharge Instructions (Signed)
Your blood work and CAT scan were unremarkable today.  You can increase your MiraLAX to twice daily.  Use your rectal suppositories for the hemorrhoids.  Take the Pepcid twice daily for abdominal pain.  You should follow-up with your primary care doctor.  Return to the ER for worsening symptoms

## 2020-10-14 NOTE — Narrator Note (Signed)
Patient ambulated to BR with steady gait independently.

## 2020-10-16 LAB — EKG

## 2020-11-14 LAB — HEMOGLOBIN A1C CARE EVERYWHERE
HEMOGLOBIN A1C CARE EVERYWHERE: 6.3 % — ABNORMAL HIGH (ref 4.3–5.6)
MEAN BLOOD GLUCOSE CARE EVERYWHERE: 134 mg/dL

## 2021-06-01 IMAGING — MG MA Digital Diagnostic Mammo
7 of 12 series · 7 of 36 positions shown · non-contrast
Comparison: none

INDICATIONS FOR EXAMINATION:                                                              
 Patient History:                                                                          
 Menarche at age 9. First Full-Term Pregnancy at age 14.                                  
 Last menstrual period: 05/31/2021                                                         
 Patient states that she had prior mammograms and biopsy done at [facility].    
 Faxing                                                                                    
 request for priors .
REASON FOR EXAM: Clinical finding.                                                                         
 Indicated Problems:                                                                       
 Pain of the left side for 3 Month(s).                                                     
 patient states that she has had increasing pain to left breast x 3 months   Patient       
 states that she                                                                           
 had a biopsy in 9012 left breast.

[L XCCL synth-2D]
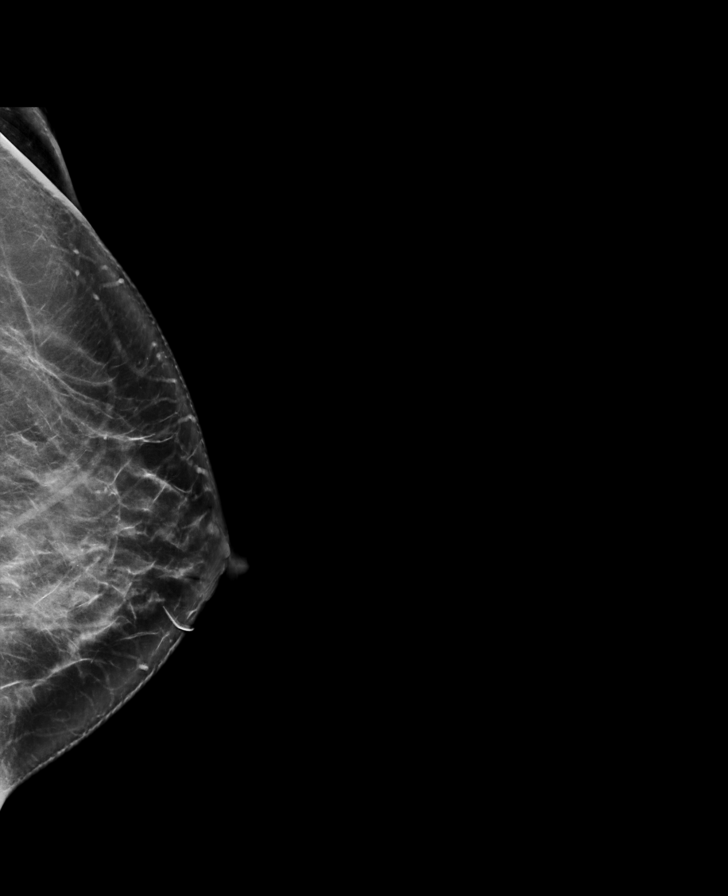

[L MLO synth-2D]
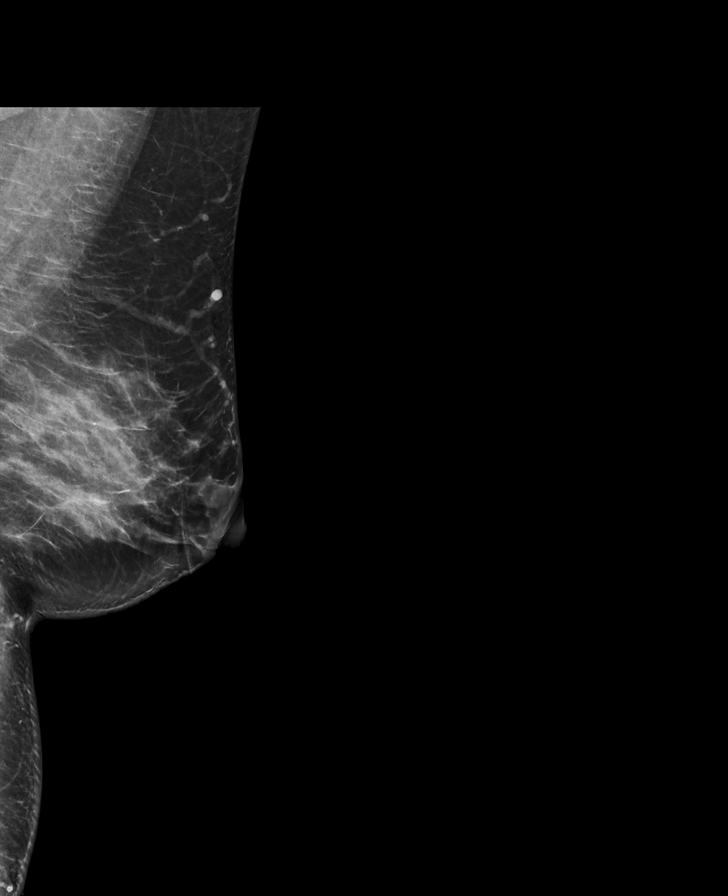

[R XCCL synth-2D]
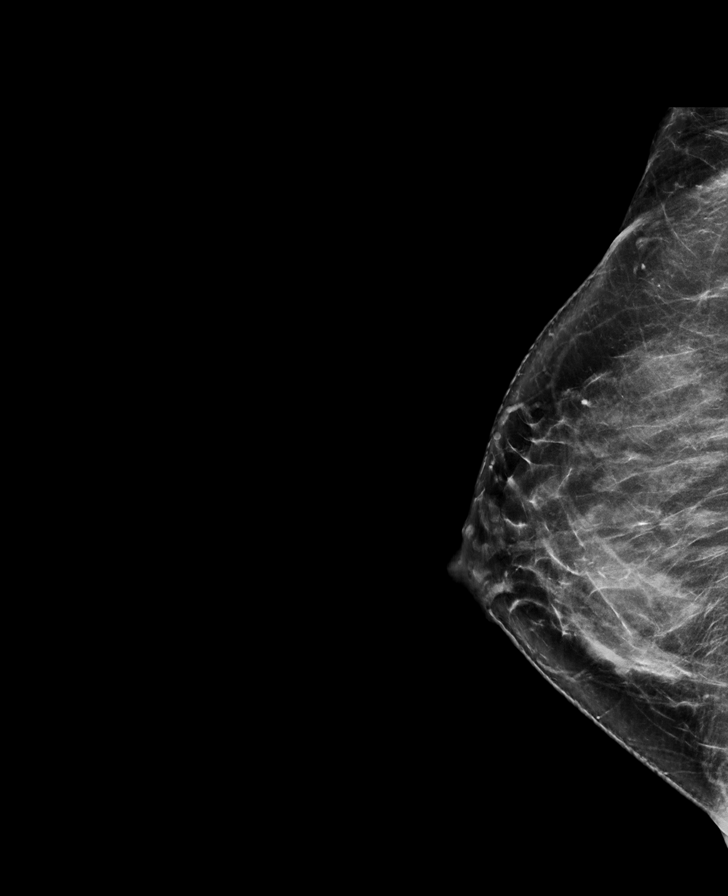

[L CC synth-2D]
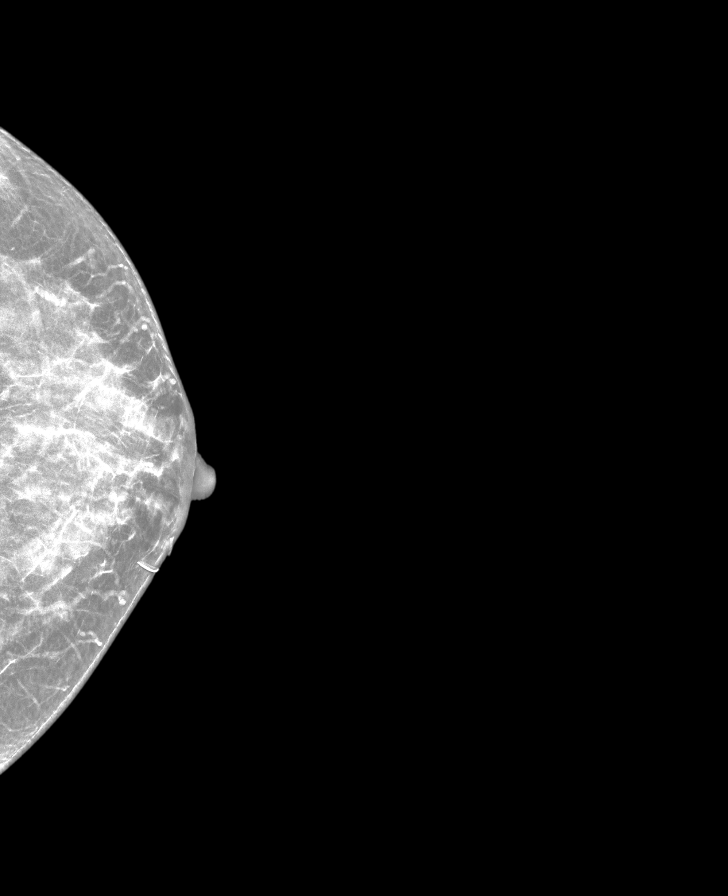

[R CC synth-2D]
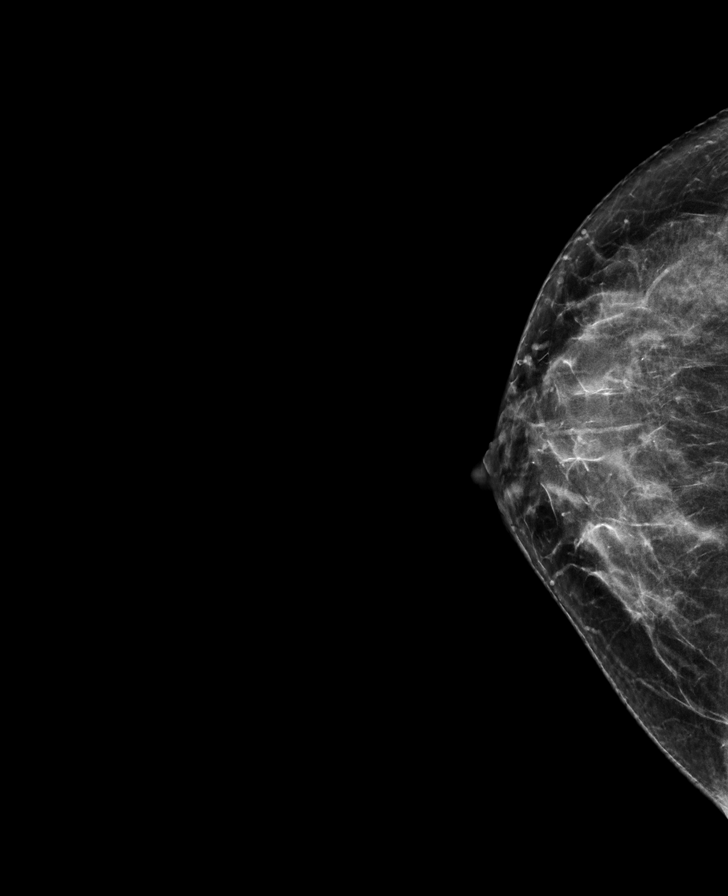

[R MLO synth-2D]
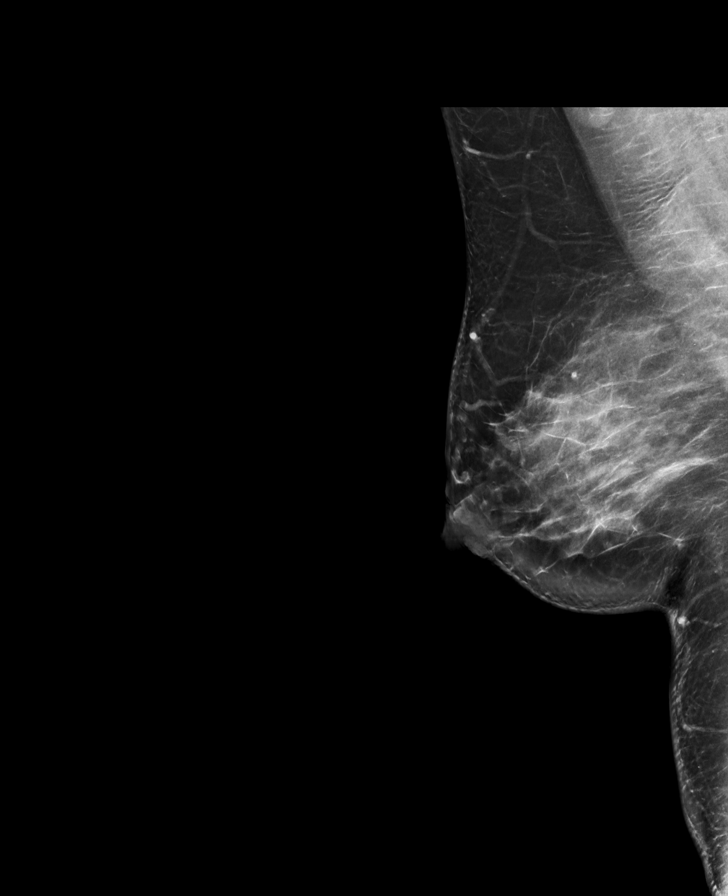

[L MLO tomo · tomo slice 38/75.0]
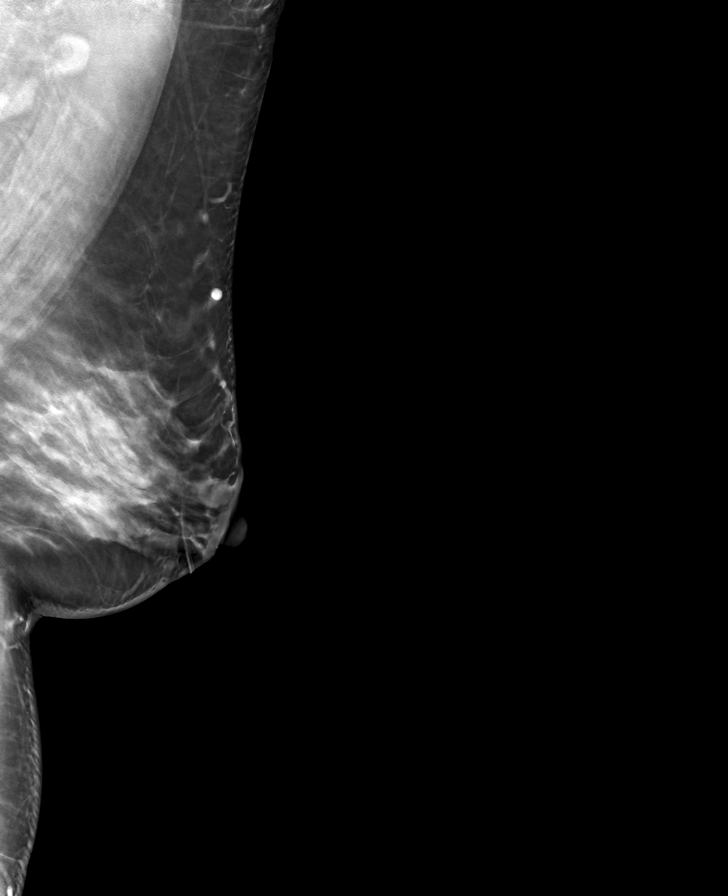

[7 of 36 positions shown; findings below may reference images not displayed]

Risk values:                                                                              
 Tyrer-Cuzick 10 year model risk: 1.3%.                                                    
 Tyrer-Cuzick Lifetime model risk: 7.3%.                                                   
 NCI Lifetime model risk: 7.9%.                                                            
 MRS Risk Manager: No High Risk calculations found at this time.                           

 Physical Findings:                                                                        
 Linear marker(s) indicate scar(s).                                                        
 Abimelk Tiger                                                                             

 DIGITAL IMAGE VIEWS:                                                                      
 Bilateral CC views were taken.                                                            
 Bilateral MLO views were taken.                                                           
 Bilateral 3D Tomo views were taken.                                                       

 PRIOR STUDY COMPARISON(S):                                                                
 No prior studies available for comparison.                                                

 TISSUE DENSITY:                                                                           
 The breasts are heterogeneously dense, which may obscure small masses.
FINDINGS: Analyzed By CAD.                                                                          
 No discrete abnormality is present in the areas of pain, in the subareolar left breast in 
 the left                                                                                  
 breast at [DATE]. No suspicious findings in either breast mammographically.                 

 US Breast Limited LT - Left: June 01, 2021 - Accession #: [id number]
FINDINGS: Ultrasound of the subareolar right breast shows a 5 mm simple cyst to be present. This    
 may or may                                                                                
 not be the etiology of the patient's pain. No suspicious sonographic findings. Ultrasound 
 of the                                                                                    
 left outer breast at [DATE], 9 cm from the nipple, in the area of pain is                   
 negative.                                                                                 

 OVERALL ACR BI-RADS ASSESSMENT: BI-RADS CATEGORY 1: NEGATIVE                              
 Digital Diagnostic Mammo - Bilateral: BI-RADS CATEGORY 1: NEGATIVE.                       
 US Breast Limited LT - Left: BI-RADS CATEGORY 1: NEGATIVE.                                

 RECOMMENDATION:                                                                           
 Screening Mammogram of both breasts in 1 year.

## 2021-10-07 ENCOUNTER — Encounter (HOSPITAL_BASED_OUTPATIENT_CLINIC_OR_DEPARTMENT_OTHER): Payer: Self-pay

## 2021-10-07 ENCOUNTER — Emergency Department
Admission: EM | Admit: 2021-10-07 | Discharge: 2021-10-07 | Disposition: A | Discharge status: Home / Self Care | Payer: No Typology Code available for payment source | Attending: Emergency Medicine | Admitting: Emergency Medicine

## 2021-10-07 ENCOUNTER — Other Ambulatory Visit: Payer: Self-pay

## 2021-10-07 DIAGNOSIS — T7840XA Allergy, unspecified, initial encounter: Secondary | ICD-10-CM | POA: Insufficient documentation

## 2021-10-07 DIAGNOSIS — R21 Rash and other nonspecific skin eruption: Secondary | ICD-10-CM | POA: Insufficient documentation

## 2021-10-07 DIAGNOSIS — X58XXXA Exposure to other specified factors, initial encounter: Secondary | ICD-10-CM | POA: Insufficient documentation

## 2021-10-07 DIAGNOSIS — R0989 Other specified symptoms and signs involving the circulatory and respiratory systems: Secondary | ICD-10-CM | POA: Diagnosis not present

## 2021-10-07 MED ORDER — ALBUTEROL SULFATE (2.5 MG/3ML) 0.083% IN NEBU
INHALATION_SOLUTION | RESPIRATORY_TRACT | Status: AC
Start: 2021-10-07 — End: 2021-10-07
  Administered 2021-10-07: 0.8333 mg via RESPIRATORY_TRACT
  Filled 2021-10-07: qty 3

## 2021-10-07 MED ORDER — EPINEPHRINE HCL 1 MG/ML INJECTION
INTRAMUSCULAR | Status: AC
Start: 2021-10-07 — End: 2021-10-07
  Administered 2021-10-07: 1 mg via INTRAMUSCULAR
  Filled 2021-10-07: qty 1

## 2021-10-07 MED ORDER — EPINEPHRINE 0.3 MG/0.3ML AUTO-INJECTOR
0.30 mg | Freq: Once | INTRAMUSCULAR | 0 refills | Status: AC | PRN
Start: 2021-10-07 — End: 2021-10-07

## 2021-10-07 MED ORDER — EPINEPHRINE HCL 1 MG/ML INJECTION
0.30 mg | Freq: Once | INTRAMUSCULAR | Status: AC
Start: 2021-10-07 — End: 2021-10-07

## 2021-10-07 MED ORDER — ALBUTEROL SULFATE (2.5 MG/3ML) 0.083% IN NEBU
2.5000 mg | INHALATION_SOLUTION | Freq: Once | RESPIRATORY_TRACT | Status: DC
Start: 2021-10-07 — End: 2021-10-07

## 2021-10-07 MED ORDER — DIPHENHYDRAMINE HCL 50 MG/ML IJ SOLN
50.0000 mg | Freq: Once | INTRAMUSCULAR | Status: AC
Start: 2021-10-07 — End: 2021-10-07
  Administered 2021-10-07: 50 mg via INTRAVENOUS
  Filled 2021-10-07: qty 1

## 2021-10-07 MED ORDER — DEXAMETHASONE SOD PHOSPHATE 10 MG/ML IJ SOLN (SUPER ERX)
10.0000 mg | Freq: Once | Status: AC
Start: 2021-10-07 — End: 2021-10-07
  Administered 2021-10-07: 10 mg via INTRAVENOUS
  Filled 2021-10-07: qty 1

## 2021-10-07 MED ORDER — PREDNISONE 20 MG PO TABS
40.00 mg | ORAL_TABLET | Freq: Every day | ORAL | 0 refills | Status: AC
Start: 2021-10-07 — End: 2021-10-11

## 2021-10-07 MED ORDER — IPRATROPIUM-ALBUTEROL 0.5-2.5 (3) MG/3ML IN SOLN
RESPIRATORY_TRACT | Status: AC
Start: 2021-10-07 — End: 2021-10-07
  Administered 2021-10-07: 6 mL via RESPIRATORY_TRACT
  Filled 2021-10-07: qty 6

## 2021-10-07 NOTE — ED Provider Notes (Addendum)
[facility] Emergency Physician Assistant Note      I, Ludger Nutting PA-C, am working in [employer] with Attending, Dr. Gwenlyn Perking    History of Present Illness:    Alexis Guzman is a 41 year old female with Past Medical History as documented below & in the patient's chart, who arrives to the ED for evaluation.    Patient with a history of diabetes arrives emergency room with complaints of facial swelling and feeling like her throat was tight.  She reports that she has been having allergic type symptoms for the past few months, states 2 weeks ago she had significant rash noted on the posterior aspect of her thighs and on her upper body consistent with hives.  She states that she also has noted some facial swelling in the past including some swelling of her upper eyelid typically unilaterally.  She states that she has not had any prior allergic reactions, no history of anaphylaxis.  She denies any new lotions, soaps, detergents, shampoos, make-up or new foods.  She does not know of anything that could be triggering her symptoms.  She states that she feels like her lip is swollen.  She states that she is not having any respiratory difficulty but does report some nausea                Pharmacologist service:   None, unless otherwise documented in patient chart/ by nursing. Language recorded as primary language in chart: Spanish [place] interpreter/ [employer]-contracted interpreter service used for language requested    Alexis Guzman   MRN: [id number]  PCP: Binnie Rail, MD    Arrived to the ED by: Self  Arrival time:     Chief complaint: Patient presents with:  Facial Problem        Prior records: reviewed the nursing triage note & any prior records that are available electronically through [place].      Review of Systems:  As documented in the HPI. 10 point review of systems are negative unless otherwise noted in chart.     Past Medical History:  Past Medical History:  No date: Diabetes mellitus ([place]) Medications:       Current Facility-Administered Medications:     albuterol (PROVENTIL) nebulizer solution 2.5 mg, 2.5 mg, Nebulization, Once, Candi Leash, MD    albuterol (PROVENTIL) nebulizer solution 2.5 mg, 2.5 mg, Nebulization, Once, Candi Leash, MD    albuterol (PROVENTIL) nebulizer solution 2.5 mg, 2.5 mg, Nebulization, Once, Candi Leash, MD    Current Outpatient Medications:     predniSONE (DELTASONE) 20 MG tablet, Take 2 tablets by mouth in the morning for 4 days., Disp: 8 tablet, Rfl: 0    EPINEPHrine (EPIPEN) 0.3 MG/0.3ML injection, Inject 0.3 mg into the muscle once as needed  for up to 1 dose, Disp: 1 each, Rfl: 0   Past Surgical History  History reviewed. No pertinent surgical history. Allergies:  Review of Patient's Allergies indicates:  No Known Allergies   Social Hx:  Social History    Tobacco Use      Smoking status: Never      Smokeless tobacco: Never    Alcohol use: Yes        Family Hx: History reviewed.  No pertinent family history.   Immunizations:  Immunization History   Administered Date(s) Administered    Covid-19 Vaccine [employer] - [employer]) 11/02/2019, 11/23/2019           Vitals:    BP 101/67  Pulse 91    Temp 98.5 F    Resp 16    Wt 90.7 kg (200 lb)    SpO2 95%           ED Triage Vitals [10/07/21 1035]   ED Triage Vitals Brief Group      Temp 98.5 F      Pulse 92      Resp 20      BP (!) 154/103      SpO2 98 %      Pain Score        Physical Examination:        The patient was wearing a mask on arrival/ was provided a mask and was wearing it during this patient encounter   For this patient encounter, I was wearing appropriate PPE as delineated by hospital protocol     Vitals: triage vital signs have been reviewed   General: Well-nourished, appears to be stated age. Cooperative with exam. In no acute distress, breathing comfortably, speaking in full sentences. No evidence of tachypnea, respiratory distress.  Skin: Warm and well perfused, no obvious rashes or lesions. No  Rash  Eyes:  Sclera noninjected, nonicteric, symmetrical lids  Head: Normocephalic and atraumatic.   Face: Mild swelling to the upper lip, no other swelling noted of the face, airway is patent, there is no stridor, no other abnormal findings in the mouth  ENT:  Atraumatic external nose & ears. Nares patent, trachea midline   Respiratory/chest: No respiratory distress, speaks in full sentences, unlabored respiratory effort. Breath sounds are clear and equal bilaterally. No wheezing  Cardiovascular: Heart rate is regular in rate and rhythm.        Musculoskeletal: Normal muscle tone, moving all extremities.  Neurologic: Alert, no focal deficits. CN II- XII grossly intact. Sensation grossly intact  Psych: appropriate mood & affect      Medications Given in the ED:      Medications   albuterol (PROVENTIL) nebulizer solution 2.5 mg (has no administration in time range)   albuterol (PROVENTIL) nebulizer solution 2.5 mg (has no administration in time range)   albuterol (PROVENTIL) nebulizer solution 2.5 mg (has no administration in time range)   dexamethasone (DECADRON) injection 10 mg (10 mg Intravenous Given 10/07/21 1044)   diphenhydrAMINE (BENADRYL) injection 50 mg (50 mg Intravenous Given 10/07/21 1043)   albuterol (PROVENTIL) (2.5 MG/3ML) 0.083% nebulizer solution (0.8333 mg Inhalation Given 10/07/21 1057)   ipratropium-albuterol (DUO-NEB) 0.5-2.5 (3) MG/3ML nebulizer solution (6 mLs Inhalation Given 10/07/21 1103)   EPINEPHrine (ADRENALIN) injection 0.3 mg (1 mg Injection Given 10/07/21 1058)    Radiology:    No orders to display    CT Abdomen & Pelvis W IV Contrast  CLINICAL INDICATION: 41 years-old Female presents with LLQ abdominal pain.    COMPARISON: 04/30/2020 and 01/04/2020.    TECHNIQUE: CT of the abdomen and pelvis following administration of intravenous contrast, with multiplanar reformats.  IV Contrast: Omnipaque 350  Oral contrast: None  Radiation Dose: Radiation dose reduction techniques were employed.  CTDIvol: 26.7 mGy. DLP: 9518 mGy-cm.    FINDINGS:     Lower thorax: Unremarkable.    Liver: Unremarkable.      Gallbladder: Surgically absent.     Biliary: Common bile duct diameter normal limits for postcholecystectomy state.    Pancreas: Unremarkable.    Spleen: Unremarkable.    Adrenals: Unremarkable.    Kidneys: Unremarkable.     Stomach: Grossly unremarkable.    Bowel: The small bowel  is grossly unremarkable. There is no bowel obstruction. There is mild colonic diverticulosis mostly near the splenic flecture. There is no pericolonic inflammation.     Appendix: Unremarkable.    Peritoneal cavity: No ascites or fluid collection. No free air.     Bladder: Unremarkable.    Reproductive organs: An IUD is noted in the uterus. The lower uterine segment/cervix appears prominent, which may be related to a fibroid. Ultrasound may be helpful for further evaluation.    Vessels: Unremarkable.    Lymph nodes: No lymphadenopathy.    Abdominal wall: Small fat-containing umbilical hernia.    Bones: Grossly stable.    IMPRESSION:    CT scan of the abdomen/pelvis reveals:  1.  No acute inflammation.  2.  Prominent lower uterine segment/cervix, may be related to a fibroid. Ultrasound may be helpful for further evaluation.        Reviewed and Electronically Signed by: Ralene Bathe MD   Signed Date/Time: 10-14-2020 12:27:31            Lab Results:    Labs Reviewed - No data to display Vital Signs:     10/07/21  1144 10/07/21  1149 10/07/21  1206 10/07/21  1308   BP:  127/77  101/67   Pulse: 106 100 99 91   Resp: 15 17 14 16    Temp:       SpO2: 96% 94% 95% 95%   Weight:            10/07/21  1144 10/07/21  1149 10/07/21  1206 10/07/21  1308   BP:  127/77  101/67   Pulse: 106 100 99 91   Resp: 15 17 14 16    Temp:       SpO2: 96% 94% 95% 95%   Weight:          EKG:     ED Course and Medical Decision Making:    BP 101/67    Pulse 91    Temp 98.5 F    Resp 16    Wt 90.7 kg (200 lb)    SpO2 95%     In summary, patient is a 41 year old  female with above past medical history who arrives to to the ED for evaluation.     Patient arrives emergency room for evaluation of swelling in her left upper lip along with feeling her like her throat is "tight ".  States that she has been having allergic type symptoms several times in the last couple months, states that most recently about 2 weeks ago she had a rash throughout her body which appeared to be hives, states that when she has had facial swelling in the past she is usually had unilateral periorbital swelling.  Denies any new lotions soaps detergents or shampoos or new foods, is unsure about what could be triggering her symptoms.    Patient arrives with normal oxygen saturation, little tacky at 110, blood pressure slightly elevated 158/101.  She is currently without any wheezing or stridor.    At this time we will treat the patient for likely allergic reaction with dexamethasone, Decadron.  We will continue to monitor the patient.    At 1049 the patient was complaining that her symptoms of throat tightness were worsening along with trouble breathing, dose of epinephrine along with albuterol was given, patient was continued to be monitored, heart rate at 1143 is 105 with an oxygen saturation 95%    Patient was monitored in the emergency department  for 2 to 3 hours, heart rate normalized to 95 and blood pressure also decreased, she is remained hemodynamically stable in the emergency department, is unsure what may be triggering the patient's symptoms, we will discharge the patient home with prednisone along with an EpiPen, she is to seek her primary care for follow-up for allergy testing.    Case was fully discussed with ED attending, Dr. Gwenlyn Perking who reviewed all diagnostic information obtained during this patient encounter &  evaluated the patient. Please see his/her supervisory note in the patient's chart for further information.     Patient/family educated on the diagnosis, she verbalizes understanding  and agrees with plan of care. She was told to follow up with her primary care physician/ other specialist as indicated in their discharge paperwork. If there is any specific return precautions, these were reviewed with the patient by the primary nurse assigned to the patient & listed in discharge paperwork. Patient will be provided standardized educational information with full return precautions.    ED Disposition:     Impression(s):  Allergic reaction, initial encounter    Disposition:  Discharge   Condition at disposition:Stable     ED Prescriptions:  Discharge Medication List as of 10/07/2021 12:59 PM    START taking these medications    predniSONE (DELTASONE) 20 MG tablet  Take 2 tablets by mouth in the morning for 4 days.  Tamperproof, Disp-8 tablet, R-0    EPINEPHrine (EPIPEN) 0.3 MG/0.3ML injection  Inject 0.3 mg into the muscle once as needed  for up to 1 dose  Tamperproof, Disp-1 each, R-0  Dispense specific brand or generic product that is covered by patient's insurance.                Signed by Leodis Liverpool, MPH  Emergency Medicine Physician Assistant  10/07/2021 1:36 PM        This Emergency Department patient encounter note was created using voice-recognition software and in real time during the ED visit, please excuse any dictation errors. Every effort has been made to appropriately edit and correct upon completion.

## 2021-10-07 NOTE — Narrator Note (Addendum)
Patient was given benadryl and decadron per provider order. Patient began coughing and gasping for air after administration. This RN called MD Gwenlyn Perking and given face to face order for epi and albuterol stat. RT at bedside. Pt appears better. LS stidor, per MD to continue 3 nebs and reevaluate. Pt tachy otherwise VSS. Call light placed within reach

## 2021-10-07 NOTE — ED Triage Note (Signed)
Pt reports upper lip swelling x 2  days, worse today, tongue numbness, nausea, and difficulty swallowing x 1 hr.   Pt stated she took allergy medicine yesterday which improved sx, today worse.   Denies any rash or SOB. Pt reports she did have a rash last week covering entire body. No new medication or food.   Lungs clear on auscultation. A & O x4. Provider at bedside on arrival.

## 2021-10-07 NOTE — Narrator Note (Signed)
Provider at bedside

## 2021-10-07 NOTE — Discharge Instructions (Addendum)
General DISCHARGE INSTRUCTIONS FROM ED PROVIDER:      - You were seen in the [facility] Emergency Department today for evaluation.   - You were seen and evaluated by health care providers including nursing and other ancillary staff, vital signs were obtained and healthcare documentation was completed.   - If indicated, appropriate diagnostics were run and appropriate treatment was administered.   - On discharge, we recommend that you read all the standardized educational information provided to you in this discharge paperwork.  - Please call your primary care doctor for follow up, or other specialist as listed in your discharge paperwork.   - Please return to the ER for any emergent concerns as listed in your discharge paperwork, if you are unable to secure appropriate follow-up as advised, if you do not experience improvement of your symptoms, or if you are experiencing new or different or concerning symptoms.     -----------------------------------------------------------------------------------------------------------------------------------------------------------    Other information regarding this visit/ specific instructions:     You were seen in the ER today for evaluation of swelling of your upper lip along with feeling like her throat is tight.  You were given medication including Benadryl, epinephrine, albuterol, Decadron.  Please seek follow-up with your primary care doctor to get allergy testing. return if you are having any repeat of her symptoms. Take steroids for the next few days. Utilize epipen if you are having any trouble breathing or feeling like throat is tight

## 2021-10-07 NOTE — Narrator Note (Signed)
Patient Disposition  Patient education for diagnosis, medications, activity, diet and follow-up.  Patient left ED 1:18 PM.  Patient rep received written instructions.    Interpreter to provide instructions: No    Patient belongings with patient: YES    Have all existing LDAs been addressed? Yes    Have all IV infusions been stopped? N/A    Destination: Discharged to home

## 2021-10-26 LAB — HEMOGLOBIN A1C CARE EVERYWHERE
HEMOGLOBIN A1C CARE EVERYWHERE: 9.9 % — ABNORMAL HIGH (ref 4.3–5.6)
MEAN BLOOD GLUCOSE CARE EVERYWHERE: 237 mg/dL

## 2022-05-17 LAB — HEMOGLOBIN A1C CARE EVERYWHERE
HEMOGLOBIN A1C CARE EVERYWHERE: 7.1 % — ABNORMAL HIGH (ref 4.3–5.6)
MEAN BLOOD GLUCOSE CARE EVERYWHERE: 157 mg/dL

## 2022-11-12 ENCOUNTER — Telehealth (HOSPITAL_BASED_OUTPATIENT_CLINIC_OR_DEPARTMENT_OTHER): Payer: Self-pay | Admitting: Registered Nurse

## 2022-11-12 NOTE — Telephone Encounter (Signed)
Erroneous  Mom was calling in regard to child.

## 2023-02-06 ENCOUNTER — Other Ambulatory Visit: Payer: Self-pay

## 2023-02-06 DIAGNOSIS — R1010 Upper abdominal pain, unspecified: Secondary | ICD-10-CM | POA: Diagnosis present

## 2023-02-06 DIAGNOSIS — R079 Chest pain, unspecified: Secondary | ICD-10-CM

## 2023-02-06 DIAGNOSIS — K2970 Gastritis, unspecified, without bleeding: Secondary | ICD-10-CM | POA: Diagnosis not present

## 2023-02-06 DIAGNOSIS — E119 Type 2 diabetes mellitus without complications: Secondary | ICD-10-CM | POA: Diagnosis not present

## 2023-02-06 DIAGNOSIS — Z7984 Long term (current) use of oral hypoglycemic drugs: Secondary | ICD-10-CM | POA: Diagnosis not present

## 2023-02-06 NOTE — ED Triage Note (Signed)
Patient reports epigastric pain radiating to back and palpitations. Pt reports pain has been waxing and waning for 1 month but has been consisently bad since last night

## 2023-02-07 ENCOUNTER — Emergency Department
Admission: EM | Admit: 2023-02-07 | Discharge: 2023-02-07 | Disposition: A | Discharge status: Home / Self Care | Payer: BC Managed Care – POS | Attending: Emergency Medicine | Admitting: Emergency Medicine

## 2023-02-07 DIAGNOSIS — K2970 Gastritis, unspecified, without bleeding: Secondary | ICD-10-CM | POA: Diagnosis not present

## 2023-02-07 DIAGNOSIS — Z7984 Long term (current) use of oral hypoglycemic drugs: Secondary | ICD-10-CM | POA: Diagnosis not present

## 2023-02-07 DIAGNOSIS — E119 Type 2 diabetes mellitus without complications: Secondary | ICD-10-CM | POA: Diagnosis not present

## 2023-02-07 LAB — CBC, PLATELET & DIFFERENTIAL
ABSOLUTE BASO COUNT: 0.1 10*3/uL (ref 0.0–0.1)
ABSOLUTE EOSINOPHIL COUNT: 0.2 10*3/uL (ref 0.0–0.8)
ABSOLUTE IMM GRAN COUNT: 0.01 10*3/uL (ref 0.00–0.10)
ABSOLUTE LYMPH COUNT: 2.5 10*3/uL (ref 0.6–5.9)
ABSOLUTE MONO COUNT: 0.6 10*3/uL (ref 0.2–1.4)
ABSOLUTE NEUTROPHIL COUNT: 3.3 10*3/uL (ref 1.6–8.3)
ABSOLUTE NRBC COUNT: 0 10*3/uL (ref 0.0–0.0)
BASOPHIL %: 0.8 % (ref 0.0–1.2)
EOSINOPHIL %: 2.4 % (ref 0.0–7.0)
HEMATOCRIT: 41.1 % (ref 34.1–44.9)
HEMOGLOBIN: 14.2 g/dL (ref 11.2–15.7)
IMMATURE GRANULOCYTE %: 0.2 % (ref 0.0–1.0)
LYMPHOCYTE %: 37.5 % (ref 15.0–54.0)
MEAN CORP HGB CONC: 34.5 g/dL (ref 31.0–37.0)
MEAN CORPUSCULAR HGB: 31.8 pg (ref 26.0–34.0)
MEAN CORPUSCULAR VOL: 91.9 fl (ref 80.0–100.0)
MEAN PLATELET VOLUME: 11 fL (ref 8.7–12.5)
MONOCYTE %: 9 % (ref 4.0–13.0)
NEUTROPHIL %: 50.1 % (ref 40.0–75.0)
NRBC %: 0 % (ref 0.0–0.0)
PLATELET COUNT: 238 10*3/uL (ref 150–400)
RBC DISTRIBUTION WIDTH STD DEV: 43.1 fL (ref 35.1–46.3)
RED BLOOD CELL COUNT: 4.47 M/uL (ref 3.90–5.20)
WHITE BLOOD CELL COUNT: 6.6 10*3/uL (ref 4.0–11.0)

## 2023-02-07 LAB — HEPATIC FUNCTION PANEL
ALANINE AMINOTRANSFERASE: 46 U/L — ABNORMAL HIGH (ref 12–45)
ALBUMIN: 3.9 g/dL (ref 3.4–5.2)
ALKALINE PHOSPHATASE: 112 U/L (ref 45–117)
ASPARTATE AMINOTRANSFERASE: 35 U/L — ABNORMAL HIGH (ref 8–34)
BILIRUBIN DIRECT: <0.2 mg/dL (ref 0.0–0.2)
BILIRUBIN TOTAL: 0.5 mg/dL (ref 0.2–1.0)
TOTAL PROTEIN: 6.6 g/dL (ref 6.4–8.2)

## 2023-02-07 LAB — BASIC METABOLIC PANEL
ANION GAP: 13 mmol/L (ref 10–22)
BUN (UREA NITROGEN): 8 mg/dL (ref 7–18)
CALCIUM: 8.3 mg/dL — ABNORMAL LOW (ref 8.5–10.5)
CARBON DIOXIDE: 23 mmol/L (ref 21–32)
CHLORIDE: 102 mmol/L (ref 98–107)
CREATININE: 0.7 mg/dL (ref 0.4–1.2)
ESTIMATED GLOMERULAR FILT RATE: >60 mL/min (ref >60)
Glucose Random: 350 mg/dL — ABNORMAL HIGH (ref 74–160)
POTASSIUM: 3.9 mmol/L (ref 3.5–5.1)
SODIUM: 138 mmol/L (ref 136–145)

## 2023-02-07 LAB — HCG QUALITATIVE SERUM: HCG QUALITATIVE SERUM: NEGATIVE

## 2023-02-07 LAB — LIPASE: LIPASE: 33 U/L (ref 13–60)

## 2023-02-07 MED ORDER — FAMOTIDINE (PF) 20 MG/2ML IV SOLN
20.0000 mg | Freq: Once | INTRAVENOUS | Status: AC
Start: 2023-02-07 — End: 2023-02-07
  Administered 2023-02-07: 20 mg via INTRAVENOUS
  Filled 2023-02-07: qty 2

## 2023-02-07 MED ORDER — ONDANSETRON HCL 4 MG/2ML IJ SOLN
4.0000 mg | Freq: Once | INTRAMUSCULAR | Status: AC
Start: 2023-02-07 — End: 2023-02-07
  Administered 2023-02-07: 4 mg via INTRAVENOUS
  Filled 2023-02-07: qty 2

## 2023-02-07 MED ORDER — SODIUM CHLORIDE 0.9 % IV BOLUS
1000.0000 mL | Freq: Once | INTRAVENOUS | Status: AC
Start: 2023-02-07 — End: 2023-02-07
  Administered 2023-02-07: 1000 mL via INTRAVENOUS

## 2023-02-07 MED ORDER — FAMOTIDINE 20 MG PO TABS
20.00 mg | ORAL_TABLET | Freq: Two times a day (BID) | ORAL | 0 refills | Status: AC
Start: 2023-02-07 — End: 2023-03-09

## 2023-02-07 MED ORDER — ALUMINUM & MAGNESIUM HYDROXIDE 200-200 MG/5ML PO SUSP
30.00 mL | Freq: Once | ORAL | Status: AC
Start: 2023-02-07 — End: 2023-02-07
  Administered 2023-02-07: 30 mL via ORAL
  Filled 2023-02-07: qty 30

## 2023-02-07 NOTE — ED Provider Notes (Signed)
The patient was seen primarily by me. ED nursing record was reviewed. Prior records as available electronically through the Epic record were reviewed.    Spanish interpreter used for history and physical    HPI:    This is a 42 year old female patient with past medical history of diabetes on metformin complaining of upper abdominal pain that is been since last night and worsening.  Patient reports she is been having intermittent pain for the past month.  Patient reports the pain started last night has been severe and continuous.  Patient reports nausea but no vomiting.  Patient reports normal bowel movement.  Patient has had prior gallbladder surgery..  Patient also reports having palpitations with the pain.  Patient reports the pain is upper abdomen epigastric       ROS: Pertinent positives were reviewed as per the HPI above. All other systems were reviewed and are negative.      Past Medical History/Problem list:  Past Medical History:  No date: Diabetes mellitus (HCC)  Patient Active Problem List:     COVID-19 virus infection     Abdominal pain, epigastric     Acute viral syndrome     BMI 40.0-44.9, adult (HCC)     Breast pain in female     Dysuria     Other chest pain     Pelvic pain     Cervicalgia     Constipation     Cough     Cyst of right ovary     Dysmenorrhea     Dyspepsia     Elevated LFTs     History of abnormal cervical Pap smear     HSV infection     Internal hemorrhoids     Intractable abdominal pain     Lower abdominal pain     IUD check up     Left foot pain     Lobular carcinoma in situ (LCIS) of left breast     Low back pain     Mantoux: positive     MDD (major depressive disorder), recurrent episode, moderate (HCC)     Nonintractable migraine     Moderate binge-eating disorder, in full remission     NASH (nonalcoholic steatohepatitis)     Non morbid obesity due to excess calories     Other dysphagia     Pain of right hand     Palpitations     Panic disorder without agoraphobia     Plantar  fasciitis of right foot     Prediabetes     Rib pain on left side     Suspected sleep apnea     Urinary tract infection symptoms     Varicose veins     Vitamin D deficiency        Past Surgical History: No past surgical history on file.      Medications:   Current Facility-Administered Medications   Medication    sodium chloride 0.9 % IV bolus 1,000 mL    famotidine (PEPCID) injection 20 mg    ondansetron (ZOFRAN) injection 4 mg     No current outpatient medications on file.         Social History: Social History    Tobacco Use      Smoking status: Never      Smokeless tobacco: Never    Alcohol use: Yes        Allergies:  Review of Patient's Allergies indicates:  No Known Allergies  Physical Exam:  BP 157/99   Pulse 101   Temp 98 F   Resp 20   Wt 90.7 kg (200 lb)   SpO2 99%     GENERAL: Uncomfortable due to pain  SKIN:  Warm & Dry, no rash.  HEAD: Atraumatic. PERRL. EOMI.  Oropharynx: clear.  NECK: No midline tenderness.  No LAN.   LUNGS:  Clear to auscultation bilaterally. No wheezes, rales, rhonchi.   HEART:  RRR.  No murmurs, rubs, or gallops.   ABDOMEN:  Soft, tender epigastric and left upper quadrant   MUSCULOSKELETAL:  No obvious deformities.    NEUROLOGIC: Alert and oriented.  Moves all extremities well.  PSYCHIATRIC:  Appropriate for age, time of day, and situation    Labs Reviewed   BASIC METABOLIC PANEL - Abnormal; Notable for the following components:       Result Value    CALCIUM 8.3 (*)     Glucose Random 350 (*)     All other components within normal limits   HEPATIC FUNCTION PANEL - Abnormal; Notable for the following components:    ASPARTATE AMINOTRANSFERASE 35 (*)     ALANINE AMINOTRANSFERASE 46 (*)     All other components within normal limits   CBC, PLATELET & DIFFERENTIAL   LIPASE   HCG QUALITATIVE SERUM         ED Course and Medical Decision-making:    The patient is a  42 year old female with Upper abdominal pain  Differential diagnosis includes gastritis versus pancreatitis versus  obstruction  EKG sinus rate of 84 no ST elevations no ST depressions no significant change from prior.   Patient's labs are unremarkable.  Patient's pain improved with Pepcid.  Will discharge with famotidine.  Patient gallbladder is already been removed.  Patient has had a history of gastritis per the chart.  Patient has no signs of obstruction had a normal bowel movement.      (K29.70) Gastritis without bleeding, unspecified chronicity, unspecified gastritis type               Joylene Grapes, MD

## 2023-02-07 NOTE — Narrator Note (Signed)
Patient Disposition  Patient education for diagnosis, medications, activity, diet and follow-up.  Patient left ED 2:15 AM.  Patient rep received written instructions.    Interpreter to provide instructions: No    Patient belongings with patient: YES    Have all existing LDAs been addressed? Yes    Have all IV infusions been stopped? Yes    Destination: Discharged to home

## 2023-02-07 NOTE — Narrator Note (Signed)
Pt lined, labs drawn and sent off. Pt medicated per MD order. Given blanket for comfort.

## 2023-02-07 NOTE — Discharge Instructions (Signed)
Your blood work was unremarkable today.  You should take the Pepcid morning and night.  Eat a light diet more clears while you are having pain and then can add back solids.  You should follow-up with your primary care doctor.  Return to the ER for any worsening symptoms.

## 2023-02-08 LAB — EKG

## 2023-05-03 LAB — POC HEMOGLOBIN A1C CARE EVERYWHERE: POC HGB A1C CARE EVERYWHERE: 10.3 % — ABNORMAL HIGH (ref 4.3–5.6)

## 2023-08-23 LAB — POC HEMOGLOBIN A1C CARE EVERYWHERE: POC HGB A1C CARE EVERYWHERE: 7.9 % — ABNORMAL HIGH (ref 4.3–5.6)

## 2023-10-25 LAB — POC HEMOGLOBIN A1C CARE EVERYWHERE: POC HGB A1C CARE EVERYWHERE: 7 % — ABNORMAL HIGH (ref 4.3–5.6)

## 2023-10-28 LAB — HEPATITIS A ANTIBODY IGG CARE EVERYWHERE: HEPATITIS A ANTIBODY IGG CARE EVERYWHERE: POSITIVE

## 2023-11-30 ENCOUNTER — Other Ambulatory Visit: Payer: Self-pay

## 2023-11-30 ENCOUNTER — Emergency Department
Admission: EM | Admit: 2023-11-30 | Discharge: 2023-11-30 | Disposition: A | Discharge status: Home / Self Care | Attending: Student in an Organized Health Care Education/Training Program | Admitting: Student in an Organized Health Care Education/Training Program

## 2023-11-30 ENCOUNTER — Emergency Department (HOSPITAL_BASED_OUTPATIENT_CLINIC_OR_DEPARTMENT_OTHER)

## 2023-11-30 DIAGNOSIS — M79674 Pain in right toe(s): Secondary | ICD-10-CM | POA: Diagnosis not present

## 2023-11-30 DIAGNOSIS — S90411A Abrasion, right great toe, initial encounter: Secondary | ICD-10-CM | POA: Diagnosis not present

## 2023-11-30 DIAGNOSIS — Y929 Unspecified place or not applicable: Secondary | ICD-10-CM | POA: Diagnosis not present

## 2023-11-30 DIAGNOSIS — L089 Local infection of the skin and subcutaneous tissue, unspecified: Secondary | ICD-10-CM | POA: Diagnosis not present

## 2023-11-30 DIAGNOSIS — W19XXXA Unspecified fall, initial encounter: Secondary | ICD-10-CM | POA: Diagnosis not present

## 2023-11-30 DIAGNOSIS — M19071 Primary osteoarthritis, right ankle and foot: Secondary | ICD-10-CM

## 2023-11-30 DIAGNOSIS — E1169 Type 2 diabetes mellitus with other specified complication: Secondary | ICD-10-CM | POA: Diagnosis present

## 2023-11-30 LAB — BLOOD SUGAR FINGERSTICK (POINT OF CARE): FINGERSTICK GLUCOSE: 194 mg/dL — ABNORMAL HIGH (ref 74–160)

## 2023-11-30 MED ORDER — IBUPROFEN 600 MG PO TABS
600.0000 mg | ORAL_TABLET | Freq: Once | ORAL | Status: AC
Start: 2023-11-30 — End: 2023-11-30
  Administered 2023-11-30: 600 mg via ORAL
  Filled 2023-11-30: qty 1

## 2023-11-30 MED ORDER — ACETAMINOPHEN 325 MG PO TABS
650.0000 mg | ORAL_TABLET | Freq: Four times a day (QID) | ORAL | 0 refills | Status: AC | PRN
Start: 2023-11-30 — End: 2023-12-10

## 2023-11-30 MED ORDER — ACETAMINOPHEN 500 MG PO TABS
1000.0000 mg | ORAL_TABLET | Freq: Once | ORAL | Status: AC
Start: 2023-11-30 — End: 2023-11-30
  Administered 2023-11-30: 1000 mg via ORAL
  Filled 2023-11-30: qty 2

## 2023-11-30 MED ORDER — CEPHALEXIN 500 MG PO CAPS
500.0000 mg | ORAL_CAPSULE | Freq: Four times a day (QID) | ORAL | 0 refills | Status: AC
Start: 2023-11-30 — End: 2023-12-05

## 2023-11-30 MED ORDER — CEPHALEXIN 500 MG PO CAPS
500.0000 mg | ORAL_CAPSULE | Freq: Once | ORAL | Status: AC
Start: 2023-11-30 — End: 2023-11-30
  Administered 2023-11-30: 500 mg via ORAL
  Filled 2023-11-30: qty 1

## 2023-11-30 MED ORDER — IBUPROFEN 600 MG PO TABS
600.0000 mg | ORAL_TABLET | Freq: Four times a day (QID) | ORAL | 0 refills | Status: AC | PRN
Start: 2023-11-30 — End: 2023-12-10

## 2023-11-30 NOTE — ED Triage Note (Signed)
 Pt hx diabetes arrived with c/o right toe pain/wound. Pt state she fell 8 day ago resulted in a cut to right great toe and broken toe nail. Toe is swollen with redness. No fevers at home per pt.

## 2023-11-30 NOTE — ED Provider Notes (Signed)
 I have reviewed the ED nursing notes and prior records. I have reviewed the patient's past medical history/problem list, allergies, social history and medication list.  I saw this patient primarily.    ED Course and Medical Decision-making:    History provided by patient.    ED Triage Vitals [11/30/23 0526]   ED Triage Vitals Brief Group      Temp 98.1 F      Pulse 87      Resp 18      BP 127/74      SpO2 98 %      Pain Score        43 year old female with history of diabetes presenting with toe pain.  Patient is well-appearing and nontoxic on exam.  Vital signs within normal limits.  Higher suspicion for cellulitis.  Plan for x-ray as patient had trauma to the toe.     My interpretation of labs: POC glucose 194      My interpretation of imaging: No acute fracture or osteomyelitis on x-ray.     Pending reassessment, patient signed out to Dr. Dorthey Guzman with dispo for discharge with antibiotics and podiatry follow-up.          While in the ED patient received:   Medications   acetaminophen  (TYLENOL ) tablet 1,000 mg (has no administration in time range)   ibuprofen  (ADVIL ) tablet 600 mg (has no administration in time range)   cephALEXin  (KEFLEX ) capsule 500 mg (has no administration in time range)       There are no discharge medications for this patient.      Patient Vitals for the past 24 hrs:   BP Temp Temp src Pulse Resp SpO2   11/30/23 0526 127/74 98.1 F Oral 87 18 98 %       Disposition: Discharge    Patient Condition: Stable     Initial Impression:  Toe infection    HPI:  43 year old female with history of diabetes presenting to [facility] with Wound Evaluation and Toe Pain  .  Patient presenting with right great toe pain for the past week.  She had a toe injury a week ago with an abrasion to the top of the toe.  She has had gradually worsening pain and swelling since.  No fever, nausea, or other injury.      Triage Documentation       Alexis Nutley, RN 11/30/2023 05:31                 Pt hx  diabetes arrived with c/o right toe pain/wound. Pt state she fell 8 day ago resulted in a cut to right great toe and broken toe nail. Toe is swollen with redness. No fevers at home per pt.            Past Medical History/Problem list:  Past Medical History:  No date: Diabetes mellitus (HCC)  Patient Active Problem List:     COVID-19 virus infection     Abdominal pain, epigastric     Acute viral syndrome     BMI 40.0-44.9, adult (HCC)     Breast pain in female     Dysuria     Other chest pain     Pelvic pain     Cervicalgia     Constipation     Cough     Cyst of right ovary     Dysmenorrhea     Dyspepsia  Elevated LFTs     History of abnormal cervical Pap smear     HSV infection     Internal hemorrhoids     Intractable abdominal pain     Lower abdominal pain     IUD check up     Left foot pain     Lobular carcinoma in situ (LCIS) of left breast     Low back pain     Mantoux: positive     MDD (major depressive disorder), recurrent episode, moderate (HCC)     Nonintractable migraine     Moderate binge-eating disorder, in full remission     NASH (nonalcoholic steatohepatitis)     Non morbid obesity due to excess calories     Other dysphagia     Pain of right hand     Palpitations     Panic disorder without agoraphobia     Plantar fasciitis of right foot     Prediabetes     Rib pain on left side     Suspected sleep apnea     Urinary tract infection symptoms     Varicose veins     Vitamin D deficiency    Past Surgical History:   No past surgical history on file.  Social History:   Social History     Socioeconomic History    Marital status: Married     Spouse name: Not on file    Number of children: Not on file    Years of education: Not on file    Highest education level: Not on file   Occupational History    Not on file   Tobacco Use    Smoking status: Never    Smokeless tobacco: Never   Substance and Sexual Activity    Alcohol  use: Yes    Drug use: Never    Sexual activity: Not on file   Other Topics Concern    Not  on file   Social History Narrative    Not on file   Social Drivers of Health  Financial Resource Strain: Not on file  Food Insecurity: Not on file  Transportation Needs: Not on file  Physical Activity: Not on file  Stress: Not on file  Social Connections: Not on file  Intimate Partner Violence: Not At Risk (07/10/2022)      Received from [facility]      Intimate Partner Violence          Are you denied basic needs such as food, clothing, or medical care?: No          In the past 12 months have you been in a relationship with a person who hurts, threatens, or tries to control you?: No          Are you denied basic needs such as food, clothing, or medical care?: No          In the past 12 months have you been in a relationship with a person who hurts, threatens, or tries to control you?: No  Housing Stability: Medium Risk (08/23/2023)      Received from [facility]      Residential Stability          What is your housing situation today?: I choose not to answer          How many times have you moved in the past 12 months?: One time  Allergies:  Review of Patient's Allergies indicates:  No Known Allergies  ROS:   Positive systems reviewed in HPI above.  All other systems were reviewed and were negative.    Physical Exam:  ED Triage Vitals [11/30/23 0526]   ED Triage Vitals Brief Group      Temp 98.1 F      Pulse 87      Resp 18      BP 127/74      SpO2 98 %      Pain Score      GENERAL:  Well-appearing, no distress   SKIN:  Warm & dry, great toe with erythema and swelling and small abrasion  HEENT:  Atraumatic   NECK:  Supple   LUNGS:  Equal and bilateral chest rise  CV: Warm and well perfused  ABDOMEN:  Soft, nontender, nondistended  MUSCULOSKELETAL:  No deformities   RLE: Neurovascularly intact, no bony tenderness or deformity, soft compartments, normal range of motion, swelling and tenderness to the great toe  NEUROLOGIC:  Normal speech.  Moving all four extremities.  PSYCHIATRIC:  Normal  affect      Medications Given in the ED:    Medications   acetaminophen  (TYLENOL ) tablet 1,000 mg (has no administration in time range)   ibuprofen  (ADVIL ) tablet 600 mg (has no administration in time range)   cephALEXin  (KEFLEX ) capsule 500 mg (has no administration in time range)       Lab Results:     Labs Reviewed   BLOOD SUGAR FINGERSTICK (POINT OF CARE) - Abnormal; Notable for the following components:       Result Value    FINGERSTICK GLUCOSE 194 (*)     All other components within normal limits               Gwynda Leriche, MD  [facility]  Emergency Medicine Attending Physician  11/30/2023 6:08 AM     Portions of this chart may have been created with Dragon voice recognition software. Occasional wrong-word or "sound-alike" substitutions may have occurred due to the inherent limitations of voice recognition software. Please read the chart carefully and recognize, using context, where these substitutions have occurred.

## 2023-11-30 NOTE — ED Notes (Signed)
 [facility] EM Attending Sign Out Note    I received signout on this patient from my colleague, Dr. Laroy Plunk, at shift change.    ED Course as of 11/30/23 0725   Sat Nov 30, 2023   0702 Signout: 43 yo F presenting with foot swelling and concern for infection.  Pending discharge on antibiotics.   1308 Patient reassessed and resting in no acute distress.  Patient amenable with decision for discharge on oral antibiotics with close follow-up with podiatry.       Plan at time of signout was patient presenting with foot swelling and concern for infection.  Pending discharge on antibiotics.    Condition: Stable    Diagnoses: Foot infection    Disposition: Pending discharge    Angelene Kelly, DO  Attending Physician  [facility]

## 2023-11-30 NOTE — Narrator Note (Signed)
 Patient Disposition  Patient education for diagnosis, medications, activity, diet and follow-up.  Patient left ED 7:40 AM.  Patient rep received written instructions.    Interpreter to provide instructions: YES    Patient belongings with patient: YES    Have all existing LDAs been addressed? NA    Have all IV infusions been stopped? NA    Destination: Pt discharged home with summary and scripts.Patient provided verbal and written discharge instructions in primary language of care.  Verbalized understanding. Left ED ambulatory.

## 2023-11-30 NOTE — Discharge Instructions (Signed)
Please follow-up with your primary care provider and podiatry within 1-2 days.  Please take all medications as prescribed.  Please return to the Emergency Department with new or worsening symptoms.

## 2023-12-04 ENCOUNTER — Ambulatory Visit: Attending: Podiatrist | Admitting: Podiatrist

## 2023-12-04 ENCOUNTER — Other Ambulatory Visit: Payer: Self-pay

## 2023-12-04 DIAGNOSIS — E1142 Type 2 diabetes mellitus with diabetic polyneuropathy: Secondary | ICD-10-CM | POA: Insufficient documentation

## 2023-12-04 DIAGNOSIS — S99921A Unspecified injury of right foot, initial encounter: Secondary | ICD-10-CM | POA: Diagnosis not present

## 2023-12-04 DIAGNOSIS — B351 Tinea unguium: Secondary | ICD-10-CM | POA: Diagnosis not present

## 2023-12-04 NOTE — Progress Notes (Signed)
 Podiatric Clinic Progress Note    CC: Patient presents with:  New Patient: Toe infection        HPI: Alexis Guzman is a 43 year old female with a history of type 2 diabetes who was referred from the ER after recent visit on 11/30/2023 for right great toe infection. She fell and injured her right great toe approximately 15 days ago and had an abrasion as well as traumatic injury to her nail.  She did have to cut her nail.  She did develop an infection and was given a course of Keflex that she has been taking.  Her wound has been improving since treatment with antibiotic.  She denies any fever or chills.    ROS  Denies fever, chills, nausea, vomitting, chest pain, shortness of breath  All systems reviewed and are negative except as outlined in the HPI.    Past medical history:  Patient Active Problem List:     COVID-19 virus infection     Abdominal pain, epigastric     Acute viral syndrome     BMI 40.0-44.9, adult (HCC)     Breast pain in female     Dysuria     Other chest pain     Pelvic pain     Cervicalgia     Constipation     Cough     Cyst of right ovary     Dysmenorrhea     Dyspepsia     Elevated LFTs     History of abnormal cervical Pap smear     HSV infection     Internal hemorrhoids     Intractable abdominal pain     Lower abdominal pain     IUD check up     Left foot pain     Lobular carcinoma in situ (LCIS) of left breast     Low back pain     Mantoux: positive     MDD (major depressive disorder), recurrent episode, moderate (HCC)     Nonintractable migraine     Moderate binge-eating disorder, in full remission     NASH (nonalcoholic steatohepatitis)     Non morbid obesity due to excess calories     Other dysphagia     Pain of right hand     Palpitations     Panic disorder without agoraphobia     Plantar fasciitis of right foot     Type 2 diabetes mellitus without complication, without long-term current use of insulin     Rib pain on left side     Suspected sleep apnea     Urinary tract infection symptoms      Varicose veins     Vitamin D deficiency       Past surgical history:  No past surgical history on file.     Medication:  acetaminophen  (TYLENOL ) 325 MG tablet, Take 2 tablets by mouth every 6 (six) hours as needed  for up to 10 days, Disp: 40 tablet, Rfl: 0  ibuprofen  (ADVIL ) 600 MG tablet, Take 1 tablet by mouth every 6 (six) hours as needed  for up to 10 days, Disp: 40 tablet, Rfl: 0  cephALEXin (KEFLEX) 500 MG capsule, Take 1 capsule by mouth in the morning and 1 capsule at noon and 1 capsule in the evening and 1 capsule before bedtime. Do all this for 5 days., Disp: 20 capsule, Rfl: 0  nortriptyline (PAMELOR) 25 MG capsule  Take 1 capsule (25 mg total) by mouth nightly  at bedtime. 90 capsule  1   08/23/2023     semaglutide (OZEMPIC) 2 mg/d subcutaneous injection pen                Allergies:  Patient has no known allergies.    Family history:  No family history on file.       Social history:  Social History    Tobacco Use      Smoking status: Never      Smokeless tobacco: Never    Alcohol  use: Yes    Drug use: Never       Objective  General: AAO x3. NAD. Pleasant.    Lower Extremity Physical Exam  Vascular: DP and PT pulses palpable. Capillary refill time immediate to all digits. Skin temperature and gradient is normal without asymmetry between the extremities.  Mild swelling around the right great toe.  Neurological: Intact epicritic sensation to bilateral feet.  Intact protective sensation with monofilament exam. No appreciable gross motor disturbances.  Musculoskeletal: Within normal limits for mass and strength for all muscle groups bilaterally.  Tender to palpation over the right great toe over the interphalangeal joint IPJ.  Dermatological: Skin is supple with normal color and texture. Small wound over the dorsal right IPJ without deep probing, purulence, or surrounding erythema. Right great toenail with remaining 25% of nail length. There is yellowish discoloration and thickening. Nailbed remains  intact.  Imaging:   X-rays right toe: Images were personally reviewed and reviewed with Dr. Jerald Molly.  There is no acute fracture or think that restriction noted.  There is no cortical erosion or periosteal reaction noted.    Labs:   Component  Ref Range & Units 10/25/23 0838   POC HGB A1C CARE EVERYWHERE  4.3 - 5.6 % 7 High      Assessment  Traumatic injury to the nail, right great toe  Wound, dorsal right great toe  Type 2 diabetes mellitus without neuropathy  Onychomycosis, right great toenail    Plan  -Patient was examined and all findings discussed.  There is resolution of infection with oral antibiotic.  We discussed local wound care with bacitracin and Band-Aid.  Right great toenail does show signs of chronic fungal infection.  We discussed potential treatment in the future if this remains a problem for the patient once the nail fully grows out with systemic antifungal.  We discussed higher risk of wound healing complications given her diabetes and that it is imperative to keep her sugars well-controlled.  She will reach out if there is any complications with wound healing.    Tennis Feinstein, DPM, PGY-2  12/04/2023    A majority of this note has been dictated with a voice recognition system. Occasional wrong-word or "sound-A-like" substitutions may have occurred due to the inherent limitations of voice recognition software. Read the chart carefully and recognize, using context, where substitutions have occurred. Please excuse any identified errors in spelling or syntax. Every effort has been made to appropriately edit and correct upon completion.

## 2023-12-06 NOTE — Progress Notes (Signed)
 I have discussed the patient's case with the resident.  I have reviewed the patient's medical history, the resident's findings on physical examination, and the patient's diagnosis and treatment plan. I agree with the documentation as written by the resident. Patient was personally seen by me.    Evelina Bucy, DPM, FACFAS     As part of today's visit, I managed and/or performed    Stable chronic illness 2, Acute, uncomplicated illness 1, Unique results reviewed 1, and Low Risk
